# Patient Record
Sex: Female | Born: 1965 | Race: White | Hispanic: No | Smoking: Never smoker
Health system: Southern US, Community
[De-identification: ages and names within clinical notes are randomized; demographics above are authoritative.]

## PROBLEM LIST (undated history)

## (undated) DIAGNOSIS — I10 Essential (primary) hypertension: Secondary | ICD-10-CM

## (undated) DIAGNOSIS — M069 Rheumatoid arthritis, unspecified: Secondary | ICD-10-CM

## (undated) DIAGNOSIS — R519 Headache, unspecified: Secondary | ICD-10-CM

## (undated) DIAGNOSIS — M311 Thrombotic microangiopathy: Secondary | ICD-10-CM

## (undated) DIAGNOSIS — R51 Headache: Secondary | ICD-10-CM

## (undated) DIAGNOSIS — M3119 Other thrombotic microangiopathy: Secondary | ICD-10-CM

## (undated) DIAGNOSIS — I7300 Raynaud's syndrome without gangrene: Secondary | ICD-10-CM

## (undated) DIAGNOSIS — N189 Chronic kidney disease, unspecified: Secondary | ICD-10-CM

## (undated) DIAGNOSIS — K859 Acute pancreatitis without necrosis or infection, unspecified: Secondary | ICD-10-CM

## (undated) DIAGNOSIS — M360 Dermato(poly)myositis in neoplastic disease: Secondary | ICD-10-CM

## (undated) HISTORY — DX: Essential (primary) hypertension: I10

## (undated) HISTORY — DX: Chronic kidney disease, unspecified: N18.9

## (undated) HISTORY — DX: Other thrombotic microangiopathy: M31.19

## (undated) HISTORY — DX: Raynaud's syndrome without gangrene: I73.00

## (undated) HISTORY — DX: Thrombotic microangiopathy: M31.1

## (undated) HISTORY — DX: Headache: R51

## (undated) HISTORY — DX: Headache, unspecified: R51.9

---

## 2004-06-17 HISTORY — PX: SINUS SURGERY WITH INSTATRAK: SHX5215

## 2014-06-17 ENCOUNTER — Encounter (HOSPITAL_COMMUNITY): Payer: Self-pay | Admitting: Emergency Medicine

## 2014-06-17 ENCOUNTER — Emergency Department (INDEPENDENT_AMBULATORY_CARE_PROVIDER_SITE_OTHER)
Admission: EM | Admit: 2014-06-17 | Discharge: 2014-06-17 | Disposition: A | Discharge status: Home / Self Care | Payer: BC Managed Care – HMO | Source: Home / Self Care | Attending: Emergency Medicine | Admitting: Emergency Medicine

## 2014-06-17 DIAGNOSIS — J019 Acute sinusitis, unspecified: Secondary | ICD-10-CM

## 2014-06-17 DIAGNOSIS — J069 Acute upper respiratory infection, unspecified: Secondary | ICD-10-CM

## 2014-06-17 HISTORY — PX: ROTATOR CUFF REPAIR: SHX139

## 2014-06-17 MED ORDER — GUAIFENESIN-CODEINE 100-10 MG/5ML PO SYRP
5.0000 mL | ORAL_SOLUTION | Freq: Three times a day (TID) | ORAL | Status: DC | PRN
Start: 2014-06-17 — End: 2015-05-27

## 2014-06-17 MED ORDER — ALBUTEROL SULFATE HFA 108 (90 BASE) MCG/ACT IN AERS: 1.0000 | INHALATION_SPRAY | Freq: Four times a day (QID) | RESPIRATORY_TRACT | Status: DC | PRN

## 2014-06-17 MED ORDER — AMOXICILLIN 500 MG PO CAPS: 1000.0000 mg | ORAL_CAPSULE | Freq: Three times a day (TID) | ORAL | Status: DC

## 2014-06-17 MED ORDER — FLUTICASONE PROPIONATE 50 MCG/ACT NA SUSP: 2.0000 | Freq: Every day | NASAL | Status: AC

## 2014-06-17 NOTE — ED Notes (Signed)
C/o cold sx onset 3 days Began w/ST that now has subsided Sx today include: HA, congestion, runny nose Denies fevers, chills Taking OTC cold meds w/no relief Alert, no signs of acute distress.

## 2014-06-17 NOTE — Discharge Instructions (Signed)

## 2014-06-17 NOTE — ED Provider Notes (Signed)
Chief Complaint   URI   History of Present Illness   Shannon Woodard is a 49 year old female who recently moved here to [place]. She was exposed to a coworker with a similar illness over the past 5 days she's had severe sore throat, nasal congestion with yellow-green drainage, headache, sinus pressure, right ear pain, cough productive yellow-green sputum, chest tightness, subjective fever, chills, malaise and fatigue, and nausea.  Review of Systems   Other than as noted above, the patient denies any of the following symptoms: Systemic:  No fevers, chills, sweats, or myalgias. Eye:  No redness or discharge. ENT:  No ear pain, headache, nasal congestion, drainage, sinus pressure, or sore throat. Neck:  No neck pain, stiffness, or swollen glands. Lungs:  No cough, sputum production, hemoptysis, wheezing, chest tightness, shortness of breath or chest pain. GI:  No abdominal pain, nausea, vomiting or diarrhea.  [place]   Past medical history, family history, social history, meds, and allergies were reviewed. She is allergic to Dilantin. She has rheumatoid arthritis, hypertension, migraines, and dermatomyositis. She takes hydrocodone for pain, Norvasc for her blood pressure and Rituxan infusions for rheumatoid arthritis.  Physical exam   Vital signs:  BP 132/83 mmHg  Pulse 86  Temp(Src) 98.1 F (36.7 C) (Oral)  Resp 16  SpO2 100%  LMP 06/17/2014 General:  Alert and oriented.  In no distress.  Skin warm and dry. Eye:  No conjunctival injection or drainage. Lids were normal. ENT:  TMs and canals were normal, without erythema or inflammation.  Nasal mucosa was clear and uncongested, without drainage.  Mucous membranes were moist.  Pharynx was clear with no exudate or drainage.  There were no oral ulcerations or lesions. Neck:  Supple, no adenopathy, tenderness or mass. Lungs:  No respiratory distress.  Lungs were clear to auscultation, without wheezes, rales or rhonchi.  Breath  sounds were clear and equal bilaterally.  Heart:  Regular rhythm, without gallops, murmers or rubs. Skin:  Clear, warm, and dry, without rash or lesions.  Assessment     The primary encounter diagnosis was Viral URI. A diagnosis of Acute sinusitis, recurrence not specified, unspecified location was also pertinent to this visit.  He only been sick for 5 days, however I believe in view of her history of rheumatoid arthritis and use of Rituxan, she is somewhat immune suppressed, therefore I'm going to start antibiotics.  Plan    1.  Meds:  The following meds were prescribed:   Discharge Medication List as of 06/17/2014 11:23 AM    START taking these medications   Details  albuterol (PROVENTIL HFA;VENTOLIN HFA) 108 (90 BASE) MCG/ACT inhaler Inhale 1-2 puffs into the lungs every 6 (six) hours as needed for wheezing or shortness of breath., Starting 06/17/2014, Until Discontinued, Normal    amoxicillin (AMOXIL) 500 MG capsule Take 2 capsules (1,000 mg total) by mouth 3 (three) times daily., Starting 06/17/2014, Until Discontinued, Normal    fluticasone (FLONASE) 50 MCG/ACT nasal spray Place 2 sprays into both nostrils daily., Starting 06/17/2014, Until Discontinued, Normal    guaiFENesin-codeine (ROBITUSSIN AC) 100-10 MG/5ML syrup Take 5 mLs by mouth 3 (three) times daily as needed for cough., Starting 06/17/2014, Until Discontinued, Print        2.  Patient Education/Counseling:  The patient was given appropriate handouts, self care instructions, and instructed in symptomatic relief.  Instructed to get extra fluids and extra rest.    3.  Follow up:  The patient was told to follow up  here if no better in 3 to 4 days, or sooner if becoming worse in any way, and given some red flag symptoms such as increasing fever, difficulty breathing, chest pain, or persistent vomiting which would prompt immediate return.       Harden Mo, MD 06/17/14 [phone]

## 2015-05-27 ENCOUNTER — Emergency Department (HOSPITAL_COMMUNITY)
Admission: EM | Admit: 2015-05-27 | Discharge: 2015-05-28 | Disposition: A | Discharge status: Home / Self Care | Payer: BLUE CROSS/BLUE SHIELD | Attending: Emergency Medicine | Admitting: Emergency Medicine

## 2015-05-27 ENCOUNTER — Encounter (HOSPITAL_COMMUNITY): Payer: Self-pay | Admitting: *Deleted

## 2015-05-27 ENCOUNTER — Emergency Department (HOSPITAL_COMMUNITY): Payer: BLUE CROSS/BLUE SHIELD

## 2015-05-27 DIAGNOSIS — Z79899 Other long term (current) drug therapy: Secondary | ICD-10-CM | POA: Insufficient documentation

## 2015-05-27 DIAGNOSIS — Z9889 Other specified postprocedural states: Secondary | ICD-10-CM | POA: Diagnosis not present

## 2015-05-27 DIAGNOSIS — R1013 Epigastric pain: Secondary | ICD-10-CM

## 2015-05-27 DIAGNOSIS — Z8719 Personal history of other diseases of the digestive system: Secondary | ICD-10-CM | POA: Insufficient documentation

## 2015-05-27 DIAGNOSIS — R1012 Left upper quadrant pain: Secondary | ICD-10-CM | POA: Diagnosis present

## 2015-05-27 DIAGNOSIS — J984 Other disorders of lung: Secondary | ICD-10-CM

## 2015-05-27 DIAGNOSIS — K2970 Gastritis, unspecified, without bleeding: Secondary | ICD-10-CM | POA: Insufficient documentation

## 2015-05-27 DIAGNOSIS — N83202 Unspecified ovarian cyst, left side: Secondary | ICD-10-CM | POA: Diagnosis not present

## 2015-05-27 DIAGNOSIS — N200 Calculus of kidney: Secondary | ICD-10-CM | POA: Diagnosis not present

## 2015-05-27 DIAGNOSIS — Z7951 Long term (current) use of inhaled steroids: Secondary | ICD-10-CM | POA: Insufficient documentation

## 2015-05-27 DIAGNOSIS — Z8739 Personal history of other diseases of the musculoskeletal system and connective tissue: Secondary | ICD-10-CM | POA: Diagnosis not present

## 2015-05-27 HISTORY — DX: Dermato(poly)myositis in neoplastic disease: M36.0

## 2015-05-27 HISTORY — DX: Rheumatoid arthritis, unspecified: M06.9

## 2015-05-27 HISTORY — DX: Acute pancreatitis without necrosis or infection, unspecified: K85.90

## 2015-05-27 LAB — COMPREHENSIVE METABOLIC PANEL
ALT: 18 U/L (ref 14–54)
AST: 21 U/L (ref 15–41)
Albumin: 4.5 g/dL (ref 3.5–5.0)
Alkaline Phosphatase: 62 U/L (ref 38–126)
Anion gap: 5 (ref 5–15)
BUN: 15 mg/dL (ref 6–20)
CO2: 23 mmol/L (ref 22–32)
Calcium: 8.8 mg/dL — ABNORMAL LOW (ref 8.9–10.3)
Chloride: 111 mmol/L (ref 101–111)
Creatinine, Ser: 0.99 mg/dL (ref 0.44–1.00)
GFR calc Af Amer: >60 mL/min (ref >60)
GFR calc non Af Amer: >60 mL/min (ref >60)
Glucose, Bld: 83 mg/dL (ref 65–99)
Potassium: 3.4 mmol/L — ABNORMAL LOW (ref 3.5–5.1)
Sodium: 139 mmol/L (ref 135–145)
Total Bilirubin: 0.5 mg/dL (ref 0.3–1.2)
Total Protein: 6.6 g/dL (ref 6.5–8.1)

## 2015-05-27 LAB — URINALYSIS, ROUTINE W REFLEX MICROSCOPIC
Bilirubin Urine: NEGATIVE
Glucose, UA: NEGATIVE mg/dL
Hgb urine dipstick: NEGATIVE
Ketones, ur: NEGATIVE mg/dL
Leukocytes, UA: NEGATIVE
Nitrite: NEGATIVE
Protein, ur: NEGATIVE mg/dL
Specific Gravity, Urine: 1.012 (ref 1.005–1.030)
pH: 6.5 (ref 5.0–8.0)

## 2015-05-27 LAB — CBC WITH DIFFERENTIAL/PLATELET
Basophils Absolute: 0 10*3/uL (ref 0.0–0.1)
Basophils Relative: 1 %
Eosinophils Absolute: 0.2 10*3/uL (ref 0.0–0.7)
Eosinophils Relative: 4 %
HCT: 39.8 % (ref 36.0–46.0)
Hemoglobin: 13.8 g/dL (ref 12.0–15.0)
Lymphocytes Relative: 22 %
Lymphs Abs: 1.2 10*3/uL (ref 0.7–4.0)
MCH: 33.2 pg (ref 26.0–34.0)
MCHC: 34.7 g/dL (ref 30.0–36.0)
MCV: 95.7 fL (ref 78.0–100.0)
Monocytes Absolute: 0.6 10*3/uL (ref 0.1–1.0)
Monocytes Relative: 12 %
Neutro Abs: 3.1 10*3/uL (ref 1.7–7.7)
Neutrophils Relative %: 61 %
Platelets: 164 10*3/uL (ref 150–400)
RBC: 4.16 MIL/uL (ref 3.87–5.11)
RDW: 12.4 % (ref 11.5–15.5)
WBC: 5.1 10*3/uL (ref 4.0–10.5)

## 2015-05-27 LAB — I-STAT TROPONIN, ED: Troponin i, poc: 0 ng/mL (ref 0.00–0.08)

## 2015-05-27 LAB — LIPASE, BLOOD: Lipase: 61 U/L — ABNORMAL HIGH (ref 11–51)

## 2015-05-27 LAB — I-STAT BETA HCG BLOOD, ED (MC, WL, AP ONLY): I-stat hCG, quantitative: <5 m[IU]/mL (ref <5)

## 2015-05-27 MED ORDER — IOHEXOL 300 MG/ML  SOLN
100.0000 mL | Freq: Once | INTRAMUSCULAR | Status: AC | PRN
  Administered 2015-05-27: 100 mL via INTRAVENOUS

## 2015-05-27 MED ORDER — MORPHINE SULFATE (PF) 4 MG/ML IV SOLN
4.0000 mg | Freq: Once | INTRAVENOUS | Status: AC
  Administered 2015-05-27: 4 mg via INTRAVENOUS
  Filled 2015-05-27: qty 1

## 2015-05-27 MED ORDER — ONDANSETRON HCL 4 MG/2ML IJ SOLN
4.0000 mg | Freq: Once | INTRAMUSCULAR | Status: AC
  Administered 2015-05-27: 4 mg via INTRAVENOUS
  Filled 2015-05-27: qty 2

## 2015-05-27 MED ORDER — SODIUM CHLORIDE 0.9 % IV BOLUS (SEPSIS)
1000.0000 mL | Freq: Once | INTRAVENOUS | Status: AC
  Administered 2015-05-27: 1000 mL via INTRAVENOUS

## 2015-05-27 NOTE — ED Notes (Signed)
Nurse starting ultrasound IV and getting labs

## 2015-05-27 NOTE — ED Provider Notes (Signed)
CSN: [id number]     Arrival date & time 05/27/15  2106 History   First MD Initiated Contact with Patient 05/27/15 2130     Chief Complaint  Patient presents with  . Abdominal Pain     (Consider location/radiation/quality/duration/timing/severity/associated sxs/prior Treatment) HPI Comments: Thurs evening cat jumped from head board to stomach, had pain then improved then pain started again   Patient is a 49 y.o. female presenting with abdominal pain.  Abdominal Pain Pain location:  LUQ Pain quality comment:  Like something is going to pop, constant but waxes and wanes, feels shooting when it comes on Pain radiates to:  Does not radiate Pain severity:  Moderate Onset quality:  Gradual Duration:  1 day Timing:  Constant Progression:  Worsening Chronicity:  New Context: not alcohol use   Relieved by:  None tried (sitting up helps) Exacerbated by: laying down flat, dont think food changes it. Ineffective treatments:  None tried Associated symptoms: no chest pain, no constipation, no cough (improved now), no diarrhea, no dysuria, no fatigue, no fever, no nausea, no shortness of breath, no sore throat, no vaginal bleeding, no vaginal discharge and no vomiting   Associated symptoms comment:  Just had URI and UTI was treated with cefdinir   Risk factors: has not had multiple surgeries, no NSAID use and not pregnant     Past Medical History  Diagnosis Date  . Rheumatoid arthritis ([place])   . 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 Pancreatitis    Past Surgical History  Procedure Laterality Date  . Cesarean section    . Sinus surgery with instatrak    . Rotator cuff repair     No family history on file. Social History  Substance Use Topics  . Smoking status: Never Smoker   . Smokeless tobacco: None  . Alcohol Use: Yes     Comment: rarely   OB History    No data available     Review of Systems  Constitutional: Negative for fever and fatigue.  HENT:  Negative for sore throat.   Eyes: Negative for visual disturbance.  Respiratory: Negative for cough (improved now) and shortness of breath.   Cardiovascular: Negative for chest pain.  Gastrointestinal: Positive for abdominal pain. Negative for nausea, vomiting, diarrhea and constipation.  Genitourinary: Negative for dysuria, vaginal bleeding, vaginal discharge and difficulty urinating.  Musculoskeletal: Negative for back pain and neck pain.  Skin: Negative for rash.  Neurological: Negative for syncope and headaches.      Allergies  Dilantin  Home Medications   Prior to Admission medications   Medication Sig Start Date End Date Taking? Authorizing Provider  albuterol (PROVENTIL HFA;VENTOLIN HFA) 108 (90 BASE) MCG/ACT inhaler Inhale 1-2 puffs into the lungs every 6 (six) hours as needed for wheezing or shortness of breath. 06/17/14  Yes Harden Mo, MD  amLODipine (NORVASC) 10 MG tablet Take 10 mg by mouth daily.   Yes Historical Provider, MD  fluticasone (FLONASE) 50 MCG/ACT nasal spray Place 2 sprays into both nostrils daily. 06/17/14  Yes Harden Mo, MD  omeprazole (PRILOSEC) 40 MG capsule Take 1 capsule by mouth daily. 03/24/15  Yes Historical Provider, MD  topiramate (TOPAMAX) 100 MG tablet Take 100 mg by mouth 2 (two) times daily.   Yes Historical Provider, MD  cefdinir (OMNICEF) 300 MG capsule Take 1 capsule by mouth 2 (two) times daily. 10 day course starting 11/28 05/15/15   Historical Provider, MD  pantoprazole (PROTONIX) 20 MG tablet Take 2  tablets (40 mg total) by mouth daily. 05/28/15   Gareth Morgan, MD   BP 119/79 mmHg  Pulse 79  Temp(Src) 98.4 F (36.9 C) (Oral)  Resp 20  SpO2 98%  LMP 04/28/2015 Physical Exam  Constitutional: She is oriented to person, place, and time. She appears well-developed and well-nourished. No distress.  HENT:  Head: Normocephalic and atraumatic.  Eyes: Conjunctivae and EOM are normal.  Neck: Normal range of motion.  Cardiovascular:  Normal rate, regular rhythm, normal heart sounds and intact distal pulses.  Exam reveals no gallop and no friction rub.   No murmur heard. Pulmonary/Chest: Effort normal and breath sounds normal. No respiratory distress. She has no wheezes. She has no rales.  Abdominal: Soft. She exhibits no distension. There is tenderness (worse in epigastrum) in the right upper quadrant, epigastric area and left upper quadrant. There is no guarding and negative Murphy's sign.  Musculoskeletal: She exhibits no edema or tenderness.  Neurological: She is alert and oriented to person, place, and time.  Skin: Skin is warm and dry. No rash noted. She is not diaphoretic. No erythema.  Nursing note and vitals reviewed.   ED Course  Procedures (including critical care time) Labs Review Labs Reviewed  COMPREHENSIVE METABOLIC PANEL - Abnormal; Notable for the following:    Potassium 3.4 (*)    Calcium 8.8 (*)    All other components within normal limits  LIPASE, BLOOD - Abnormal; Notable for the following:    Lipase 61 (*)    All other components within normal limits  CBC WITH DIFFERENTIAL/PLATELET  URINALYSIS, ROUTINE W REFLEX MICROSCOPIC (NOT AT [facility])  I-STAT TROPOININ, ED  I-STAT BETA HCG BLOOD, ED ([facility], [facility], [facility] ONLY)    Imaging Review Ct Abdomen Pelvis W Contrast  05/28/2015  CLINICAL DATA:  LEFT-sided abdominal pain beginning yesterday, worse when laying down. Mild nausea. History of pancreatitis. Recent treatment for urinary tract infection. History of polymyositis, pancreatitis, rheumatoid arthritis. EXAM: CT ABDOMEN AND PELVIS WITH CONTRAST TECHNIQUE: Multidetector CT imaging of the abdomen and pelvis was performed using the standard protocol following bolus administration of intravenous contrast. CONTRAST:  187mL OMNIPAQUE IOHEXOL 300 MG/ML  SOLN COMPARISON:  None. FINDINGS: LUNG BASES: Mild suspected honeycombing in the lung bases. Lingular atelectasis/ scarring. Heart size is normal, no pericardial  effusions. SOLID ORGANS: The liver demonstrates subcentimeter probable cyst segment 5, mild focal fatty infiltration about the falciform ligament. Spleen, gallbladder, pancreas and adrenal glands are unremarkable. GASTROINTESTINAL TRACT: The stomach, small bowel are normal in course and caliber without inflammatory changes. Moderate amount of retained large bowel stool. Redundant cecum and LEFT lower quadrant. KIDNEYS/ URINARY TRACT: Kidneys are orthotopic, demonstrating symmetric enhancement. 3 mm LEFT lower pole nephrolithiasis. No hydronephrosis or solid renal masses. The unopacified ureters are normal in course and caliber. Delayed imaging through the kidneys demonstrates symmetric prompt contrast excretion within the proximal urinary collecting system. Urinary bladder is well distended and unremarkable. PERITONEUM/RETROPERITONEUM: Aortoiliac vessels are normal in course and caliber, trace calcific atherosclerosis. No lymphadenopathy by CT size criteria. Multiple subcentimeter enhancing probable leiomyomas ; deformity of the endometrium conferring intramural leiomyoma. Multiple involuting follicles measuring up to 1.7 cm LEFT ovary. Small amount of free fluid in the pelvis is likely physiologic. Small amount of free fluid about the LEFT adnexae. SOFT TISSUE/OSSEOUS STRUCTURES: Non-suspicious. Probable suture anchor RIGHT pubic symphysis. Grade 1 L5-S1 anterolisthesis without spondylolysis. IMPRESSION: Nonobstructing LEFT 3 mm nephrolithiasis. Involuting, possibly ruptured LEFT ovarian follicles/cysts measuring up to 1.7 cm with small amount of  free fluid about LEFT adnexa. Multiple apparent uterine leiomyomas. Moderate amount of retained large bowel stool without bowel obstruction. Suspected honeycombing in the lung bases which can be seen with interstitial lung disease and, would be better characterized on high-resolution CT chest on a nonemergent basis. Electronically Signed   By: Elon Alas M.D.   On:  05/28/2015 00:23   I have personally reviewed and evaluated these images and lab results as part of my medical decision-making.   EKG Interpretation   Date/Time:  Saturday May 27 2015 21:57:50 EST Ventricular Rate:  71 PR Interval:  174 QRS Duration: 98 QT Interval:  422 QTC Calculation: 459 R Axis:   72 Text Interpretation:  Sinus rhythm Artifact No prior ECG to compare  Confirmed by [facility] MD, Rebeckah Masih ([id number]) on 05/27/2015 11:53:25 PM      MDM   Final diagnoses:  Epigastric pain  Cyst of left ovary, possibly ruptured  Nephrolithiasis, 61mm left sided, nonobstructing  Gastritis  Honeycomb lung, possible signs of interstitial lung disease on CT abdomen, recommend outpt CT Chest for further evaluation   49 year old female with a history of rheumatoid arthritis, traumatic polymyositis, pancreatitis presents with concern of left upper quadrant pain beginning yesterday. Patient states that her cat jumped from a height onto her abdomen the night before the pain began. Location of pain and question of trauma concerning for possible splenic injury, however pt with tenderness also in epigastrum and RUQ.  Given tenderness in LUQ not significant and positional nature of pain in pt with inflammatory disease hx, ECG was ordered to evaluate for pericarditis. Istat trop negative.  No CP and doubt ACS/PE. Lipase mildly elevated, however not 3 times the upper limit of normal.  CT abdomen ordered showing left ovarian cyst, possible rupture with free fluid, nonobstructing stone, signs of interstitial lung disease.  Pt without lower pelvic pain and doubt torsion/PID/abscess.  Recommend OBGYN follow up for cyst and PCP follow up for other abdominal pain. Possible gastritis, wrote rx for protonix and discussed may take this or omeprazole.  Patient discharged in stable condition with understanding of reasons to return.     Gareth Morgan, MD 05/28/15 0140

## 2015-05-27 NOTE — ED Notes (Signed)
Pt states that she began to have left sided abd pain yesterday; pt states that the pain is worse when she lays on her back; pt states that she feel like she needs to stretch her chest up to allow for more room in her abdomen; pt c/o mild nausea but no vomiting or diarrhea; pt states that she has a hx of pancreatitis and was recently treated for a UTI

## 2015-05-28 MED ORDER — PANTOPRAZOLE SODIUM 20 MG PO TBEC: 40.0000 mg | DELAYED_RELEASE_TABLET | Freq: Every day | ORAL | Status: DC

## 2015-05-28 MED ORDER — MORPHINE SULFATE (PF) 4 MG/ML IV SOLN
4.0000 mg | Freq: Once | INTRAVENOUS | Status: AC
  Administered 2015-05-28: 4 mg via INTRAVENOUS
  Filled 2015-05-28: qty 1

## 2015-05-28 NOTE — Discharge Instructions (Signed)

## 2015-07-03 ENCOUNTER — Encounter: Payer: Self-pay | Admitting: Diagnostic Neuroimaging

## 2015-07-03 ENCOUNTER — Ambulatory Visit (INDEPENDENT_AMBULATORY_CARE_PROVIDER_SITE_OTHER): Payer: 59 | Admitting: Diagnostic Neuroimaging

## 2015-07-03 VITALS — BP 125/78 | HR 65 | Ht 63.0 in | Wt 121.4 lb

## 2015-07-03 DIAGNOSIS — G43109 Migraine with aura, not intractable, without status migrainosus: Secondary | ICD-10-CM | POA: Diagnosis not present

## 2015-07-03 MED ORDER — SUMATRIPTAN 20 MG/ACT NA SOLN: 20.0000 mg | NASAL | Status: DC | PRN

## 2015-07-03 MED ORDER — GABAPENTIN 100 MG PO CAPS: 100.0000 mg | ORAL_CAPSULE | Freq: Every day | ORAL | Status: DC

## 2015-07-03 MED ORDER — TOPIRAMATE 100 MG PO TABS: 100.0000 mg | ORAL_TABLET | Freq: Two times a day (BID) | ORAL | Status: AC

## 2015-07-03 NOTE — Progress Notes (Signed)
[facility]  PATIENT: Shannon Woodard [facility] DOB: [birth year]  REFERRING CLINICIAN: Julien Girt HISTORY FROM: patient  REASON FOR VISIT: new consult    HISTORICAL  CHIEF COMPLAINT:  Chief Complaint  Patient presents with  . Migraine    rm 7 , New Pt, "new to area, recently dx w/RA; migraines w/menses and midmonth"    HISTORY OF PRESENT ILLNESS:   50 year old female here for evaluation of headaches/migraines.  Since eighth grade patient has had intermittent headaches, unilateral, ice pick sensation, eye watering, photophobia, phonophobia. She would've nausea and vomiting. Sometimes she would miss school. Patient was diagnosed with migraine headaches. Sometimes she sees spots and sparkles with these.  Patient had these headaches intermittently throughout her life. At some point she was started on topiramate and did well. She also tried Imitrex nasal spray and injection with good results. At one point she tried Botox, but this caused neck weakness and worsening headaches.  One year ago patient moved to [place] and is now trying to establish care. Patient having 1-10 days of migraine headache per month. She averages 4 headaches per month. Headaches last up to 12 hours. Headaches tend to be associated with her menstrual cycle.   REVIEW OF SYSTEMS: Full 14 system review of systems performed and notable only for memory loss confusion headache restless legs joint pain cramps allergies palpitations murmur itching.  ALLERGIES: Allergies  Allergen Reactions  . Dilantin [Phenytoin Sodium Extended] Rash and Other (See Comments)    Extreme sedation    HOME MEDICATIONS: Outpatient Prescriptions Prior to Visit  Medication Sig Dispense Refill  . albuterol (PROVENTIL HFA;VENTOLIN HFA) 108 (90 BASE) MCG/ACT inhaler Inhale 1-2 puffs into the lungs every 6 (six) hours as needed for wheezing or shortness of breath. 1 Inhaler 0  . amLODipine (NORVASC) 10 MG tablet Take 10 mg  by mouth daily.    . fluticasone (FLONASE) 50 MCG/ACT nasal spray Place 2 sprays into both nostrils daily. 16 g 0  . omeprazole (PRILOSEC) 40 MG capsule Take 1 capsule by mouth daily.  2  . topiramate (TOPAMAX) 100 MG tablet Take 100 mg by mouth 2 (two) times daily.    . cefdinir (OMNICEF) 300 MG capsule Take 1 capsule by mouth 2 (two) times daily. 10 day course starting 11/28  0  . pantoprazole (PROTONIX) 20 MG tablet Take 2 tablets (40 mg total) by mouth daily. 30 tablet 0   No facility-administered medications prior to visit.    PAST MEDICAL HISTORY: Past Medical History  Diagnosis Date  . Rheumatoid arthritis ([place])   . 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 Pancreatitis   . Hypertension   . Chronic kidney disease     hx kidney failure- resolved  . 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 Raynauds phenomenon   . Headache     migraines    PAST SURGICAL HISTORY: Past Surgical History  Procedure Laterality Date  . Cesarean section  1990, 1994  . Sinus surgery with instatrak  2006  . Rotator cuff repair Left 2016    FAMILY HISTORY: Family History  Problem Relation Age of Onset  . Cancer Mother     brain  . Thyroid disease Sister   . Diabetes Brother   . Alzheimer's disease Paternal Grandmother   . Cancer Paternal Grandfather     bone cancer    SOCIAL HISTORY:  Social History   Social History  . Marital Status: Divorced    Spouse Name:  N/A  . Number of Children: 2  . Years of Education: 16   Occupational History  .      [employer]   Social History Main Topics  . Smoking status: Never Smoker   . Smokeless tobacco: Not on file  . Alcohol Use: Yes     Comment: rarely  . Drug Use: No  . Sexual Activity: Not on file   Other Topics Concern  . Not on file   Social History Narrative   Lives at home with sister, brother-in-law; has significant other   caffeine use- coffee, 1-2 cups daily      PHYSICAL EXAM  GENERAL  EXAM/CONSTITUTIONAL: Vitals:  Filed Vitals:   07/03/15 0911  BP: 125/78  Pulse: 65  Height: 5\' 3"  (1.6 m)  Weight: 121 lb 6.4 oz (55.067 kg)     Body mass index is 21.51 kg/(m^2).  Visual Acuity Screening   Right eye Left eye Both eyes  Without correction: 20/30 20/30   With correction:        Patient is in no distress; well developed, nourished and groomed; neck is supple  CARDIOVASCULAR:  Examination of carotid arteries is normal; no carotid bruits  Regular rate and rhythm, no murmurs  Examination of peripheral vascular system by observation and palpation is normal  EYES:  Ophthalmoscopic exam of optic discs and posterior segments is normal; no papilledema or hemorrhages  MUSCULOSKELETAL:  Gait, strength, tone, movements noted in Neurologic exam below  NEUROLOGIC: MENTAL STATUS:  No flowsheet data found.  awake, alert, oriented to person, place and time  recent and remote memory intact  normal attention and concentration  language fluent, comprehension intact, naming intact,   fund of knowledge appropriate  CRANIAL NERVE:   2nd - no papilledema on fundoscopic exam  2nd, 3rd, 4th, 6th - pupils equal and reactive to light, visual fields full to confrontation, extraocular muscles intact, no nystagmus  5th - facial sensation symmetric  7th - facial strength symmetric  8th - hearing intact  9th - palate elevates symmetrically, uvula midline  11th - shoulder shrug symmetric  12th - tongue protrusion midline  MOTOR:   normal bulk and tone, full strength in the BUE, BLE  SENSORY:   normal and symmetric to light touch, pinprick, temperature, vibration    COORDINATION:   finger-nose-finger, fine finger movements normal  REFLEXES:   deep tendon reflexes present and symmetric  GAIT/STATION:   narrow based gait; able to walk on tandem; romberg is negative    DIAGNOSTIC DATA (LABS, IMAGING, TESTING) - I reviewed patient records, labs,  notes, testing and imaging myself where available.  Lab Results  Component Value Date   WBC 5.1 05/27/2015   HGB 13.8 05/27/2015   HCT 39.8 05/27/2015   MCV 95.7 05/27/2015   PLT 164 05/27/2015      Component Value Date/Time   NA 139 05/27/2015 2307   K 3.4* 05/27/2015 2307   CL 111 05/27/2015 2307   CO2 23 05/27/2015 2307   GLUCOSE 83 05/27/2015 2307   BUN 15 05/27/2015 2307   CREATININE 0.99 05/27/2015 2307   CALCIUM 8.8* 05/27/2015 2307   PROT 6.6 05/27/2015 2307   ALBUMIN 4.5 05/27/2015 2307   AST 21 05/27/2015 2307   ALT 18 05/27/2015 2307   ALKPHOS 62 05/27/2015 2307   BILITOT 0.5 05/27/2015 2307   GFRNONAA >60 05/27/2015 2307   GFRAA >60 05/27/2015 2307   No results found for: CHOL, HDL, LDLCALC, LDLDIRECT, TRIG, CHOLHDL  No results found for: HGBA1C No results found for: VITAMINB12 No results found for: TSH     ASSESSMENT AND PLAN  50 y.o. year old female here with history of migraine headaches with aura since eighth grade. Left eye slightly increasing topiramate although advise caution as patient has history of asymptomatic kidney stones. Also we'll try gabapentin and sumatriptan nasal spray.   Dx:   Migraine with aura and without status migrainosus, not intractable     PLAN: - increase topiramate to 100mg  BID; caution with kidney stones; drink plenty of fluids - trial of gabapentin 100mg  qhs; increase to 300mg  as tolerated - trial of sumatriptan nasal spray   Meds ordered this encounter  Medications  . topiramate (TOPAMAX) 100 MG tablet    Sig: Take 1 tablet (100 mg total) by mouth 2 (two) times daily.    Dispense:  180 tablet    Refill:  4  . SUMAtriptan (IMITREX) 20 MG/ACT nasal spray    Sig: Place 1 spray (20 mg total) into the nose every 2 (two) hours as needed for migraine or headache. May repeat in 2 hours if headache persists or recurs.    Dispense:  6 Inhaler    Refill:  6  . gabapentin (NEURONTIN) 100 MG capsule    Sig: Take 1-3  capsules (100-300 mg total) by mouth at bedtime.    Dispense:  90 capsule    Refill:  6   Return in about 2 months (around 08/31/2015).    Penni Bombard, MD [id number], [id number] AM Certified in Neurology, Neurophysiology and Neuroimaging  [facility] [address] [place], [place] [address] [phone]

## 2015-07-03 NOTE — Patient Instructions (Addendum)
Thank you for coming to see [place] at [facility]. I hope we have been able to provide you high quality care today.  You may receive a patient satisfaction survey over the next few weeks. We would appreciate your feedback and comments so that we may continue to improve ourselves and the health of our patients.  - increase topiramate to 176m twice a day; drink plenty of fluids; caution with kidney stone development - use sumatriptan nasal spray as needed for migraine - try gabapentin 1021mat bedtime; gradually increase to 30074mt bedtime - may use over the counter tylenol, excedrin migraine or ibuprofen as needed with above medications; caution with stomach pain or suspected gastritis   ~~~~~~~~~~~~~~~~~~~~~~~~~~~~~~~~~~~~~~~~~~~~~~~~~~~~~~~~~~~~~~~~~  DR. Townes Fuhs'S GUIDE TO HAPPY AND HEALTHY LIVING These are some of my general health and wellness recommendations. Some of them may apply to you better than others. Please use common sense as you try these suggestions and feel free to ask me any questions.   ACTIVITY/FITNESS Mental, social, emotional and physical stimulation are very important for brain and body health. Try learning a new activity (arts, music, language, sports, games).  Keep moving your body to the best of your abilities. You can do this at home, inside or outside, the park, community center, gym or anywhere you like. Consider a physical therapist or personal trainer to get started. Consider the app Sworkit. Fitness trackers such as smart-watches, smart-phones or Fitbits can help as well.   NUTRITION Eat more plants: colorful vegetables, nuts, seeds and berries.  Eat less sugar, salt, preservatives and processed foods.  Avoid toxins such as cigarettes and alcohol.  Drink water when you are thirsty. Warm water with a slice of lemon is an excellent morning drink to start the day.  Consider these websites for more information The Nutrition Source  (htthttps://www.henry-hernandez.biz/recision Nutrition (wwwWindowBlog.ch RELAXATION Consider practicing mindfulness meditation or other relaxation techniques such as deep breathing, prayer, yoga, tai chi, massage. See website mindful.org or the apps Headspace or Calm to help get started.   SLEEP Try to get at least 7-8+ hours sleep per day. Regular exercise and reduced caffeine will help you sleep better. Practice good sleep hygeine techniques. See website sleep.org for more information.   PLANNING Prepare estate planning, living will, healthcare POA documents. Sometimes this is best planned with the help of an attorney. Theconversationproject.org and agingwithdignity.org are excellent resources.

## 2015-08-21 ENCOUNTER — Telehealth: Payer: Self-pay | Admitting: Diagnostic Neuroimaging

## 2015-08-21 NOTE — Telephone Encounter (Signed)
Pt called to c/a appt with Dr Leta Baptist. She said at the [place] she feels he did not listen to how often she has migraines. Sts he told her to continue taking the Goody powders and Excedrin Migraine along with Imitrex prn. Sts she had HA for 15 days after OV and feels she took an excessive amount of Goody Powders. The preventative with the past neurologist was topamax, fioricet and imitrex. She said it worked really well, she used fioricet and imitrex for rescue medications. She said Dr Aundria Rud kept her on  topamax which he increased to 200mg , the fioricet which he was against and did not refill, and added gabapentin. Sts she could not tolerate gabapentin as it gave her H   She is requesting to see Dr Jaynee Eagles.  Pt is aware she will receive a phone with the providers decision.

## 2015-08-22 NOTE — Telephone Encounter (Signed)
I will discuss with Dr. Leta Baptist thanks

## 2015-08-24 NOTE — Telephone Encounter (Signed)
Hi Shannon Woodard, would you let patient know that I am happy to see her but just to let her know that I don't usually prescribe Fioricet either. But happy to see her thanks.

## 2015-08-24 NOTE — Telephone Encounter (Signed)
Ok to see Dr. Jaynee Eagles if she agrees.

## 2015-08-25 NOTE — Telephone Encounter (Signed)
Called and spoke to pt. Advised ok per Dr Jaynee Eagles to see her. She spoke to Dr Leta Baptist. Dr Jaynee Eagles does not usually prescribe Fioricet either. She was driving and is going to call office back to schedule appt w/ Dr Jaynee Eagles. Told her phone staff can also schedule f/u. She verbalized understanding.   Please schedule f/u w/ Dr Jaynee Eagles when she calls back, thank you.

## 2015-10-05 ENCOUNTER — Ambulatory Visit: Payer: 59 | Admitting: Diagnostic Neuroimaging

## 2015-12-04 ENCOUNTER — Other Ambulatory Visit: Payer: Self-pay | Admitting: Family Medicine

## 2015-12-04 DIAGNOSIS — R1084 Generalized abdominal pain: Secondary | ICD-10-CM

## 2015-12-06 ENCOUNTER — Ambulatory Visit
Admission: RE | Admit: 2015-12-06 | Discharge: 2015-12-06 | Disposition: A | Discharge status: Home / Self Care | Payer: 59 | Source: Ambulatory Visit | Attending: Family Medicine | Admitting: Family Medicine

## 2015-12-06 DIAGNOSIS — R1084 Generalized abdominal pain: Secondary | ICD-10-CM

## 2015-12-12 ENCOUNTER — Other Ambulatory Visit: Payer: BC Managed Care – HMO

## 2016-07-07 ENCOUNTER — Other Ambulatory Visit: Payer: Self-pay | Admitting: Diagnostic Neuroimaging

## 2016-08-19 ENCOUNTER — Other Ambulatory Visit: Payer: Self-pay | Admitting: Diagnostic Neuroimaging

## 2017-09-02 ENCOUNTER — Encounter: Payer: Self-pay | Admitting: Family Medicine

## 2018-04-03 ENCOUNTER — Encounter: Payer: Self-pay | Admitting: Family Medicine

## 2019-03-16 ENCOUNTER — Other Ambulatory Visit: Payer: Self-pay | Admitting: Family Medicine

## 2019-03-16 ENCOUNTER — Other Ambulatory Visit: Payer: Self-pay

## 2019-03-16 ENCOUNTER — Ambulatory Visit
Admission: RE | Admit: 2019-03-16 | Discharge: 2019-03-16 | Disposition: A | Discharge status: Home / Self Care | Payer: 59 | Source: Ambulatory Visit | Attending: Family Medicine | Admitting: Family Medicine

## 2019-03-16 DIAGNOSIS — R519 Headache, unspecified: Secondary | ICD-10-CM

## 2019-03-16 MED ORDER — GADOBENATE DIMEGLUMINE 529 MG/ML IV SOLN
10.0000 mL | Freq: Once | INTRAVENOUS | Status: AC | PRN
  Administered 2019-03-16: 10 mL via INTRAVENOUS

## 2019-04-06 ENCOUNTER — Ambulatory Visit: Payer: 59 | Admitting: Cardiology

## 2019-07-26 ENCOUNTER — Other Ambulatory Visit: Payer: Self-pay | Admitting: Family Medicine

## 2019-07-26 DIAGNOSIS — R1030 Lower abdominal pain, unspecified: Secondary | ICD-10-CM

## 2019-07-27 ENCOUNTER — Ambulatory Visit: Payer: 59

## 2019-07-27 ENCOUNTER — Other Ambulatory Visit: Payer: Self-pay | Admitting: Family Medicine

## 2019-07-27 ENCOUNTER — Other Ambulatory Visit: Payer: Self-pay

## 2019-07-27 DIAGNOSIS — R1030 Lower abdominal pain, unspecified: Secondary | ICD-10-CM

## 2019-08-13 ENCOUNTER — Telehealth: Payer: Self-pay | Admitting: Hematology

## 2019-08-13 NOTE — Telephone Encounter (Signed)
Scheduled per referral. Called and spoke with pt, confirmed 3/10 appt. Pt aware to arrive 71mins early and to also bring insurance card and photo ID.

## 2019-08-24 ENCOUNTER — Other Ambulatory Visit: Payer: Self-pay

## 2019-08-24 DIAGNOSIS — C9000 Multiple myeloma not having achieved remission: Secondary | ICD-10-CM

## 2019-08-25 ENCOUNTER — Inpatient Hospital Stay: Payer: 59 | Attending: Hematology | Admitting: Hematology

## 2019-08-25 ENCOUNTER — Other Ambulatory Visit: Payer: Self-pay

## 2019-08-25 ENCOUNTER — Inpatient Hospital Stay: Payer: 59

## 2019-08-25 VITALS — BP 121/81 | HR 87 | Temp 98.0°F | Resp 16 | Ht 62.0 in | Wt 112.7 lb

## 2019-08-25 DIAGNOSIS — R079 Chest pain, unspecified: Secondary | ICD-10-CM | POA: Diagnosis not present

## 2019-08-25 DIAGNOSIS — C9000 Multiple myeloma not having achieved remission: Secondary | ICD-10-CM

## 2019-08-25 DIAGNOSIS — D472 Monoclonal gammopathy: Secondary | ICD-10-CM | POA: Diagnosis not present

## 2019-08-25 DIAGNOSIS — R509 Fever, unspecified: Secondary | ICD-10-CM | POA: Diagnosis not present

## 2019-08-25 DIAGNOSIS — Z808 Family history of malignant neoplasm of other organs or systems: Secondary | ICD-10-CM | POA: Insufficient documentation

## 2019-08-25 DIAGNOSIS — R1030 Lower abdominal pain, unspecified: Secondary | ICD-10-CM | POA: Insufficient documentation

## 2019-08-25 DIAGNOSIS — G43909 Migraine, unspecified, not intractable, without status migrainosus: Secondary | ICD-10-CM | POA: Insufficient documentation

## 2019-08-25 DIAGNOSIS — M069 Rheumatoid arthritis, unspecified: Secondary | ICD-10-CM | POA: Diagnosis not present

## 2019-08-25 LAB — HEPATITIS C ANTIBODY: HCV Ab: NONREACTIVE

## 2019-08-25 LAB — CMP (CANCER CENTER ONLY)
ALT: 18 U/L (ref 0–44)
AST: 21 U/L (ref 15–41)
Albumin: 4.6 g/dL (ref 3.5–5.0)
Alkaline Phosphatase: 74 U/L (ref 38–126)
Anion gap: 9 (ref 5–15)
BUN: 15 mg/dL (ref 6–20)
CO2: 25 mmol/L (ref 22–32)
Calcium: 9.1 mg/dL (ref 8.9–10.3)
Chloride: 109 mmol/L (ref 98–111)
Creatinine: 0.87 mg/dL (ref 0.44–1.00)
GFR, Est AFR Am: >60 mL/min (ref >60)
GFR, Estimated: >60 mL/min (ref >60)
Glucose, Bld: 85 mg/dL (ref 70–99)
Potassium: 3.7 mmol/L (ref 3.5–5.1)
Sodium: 143 mmol/L (ref 135–145)
Total Bilirubin: 0.3 mg/dL (ref 0.3–1.2)
Total Protein: 6.7 g/dL (ref 6.5–8.1)

## 2019-08-25 LAB — CBC WITH DIFFERENTIAL/PLATELET
Abs Immature Granulocytes: 0.01 10*3/uL (ref 0.00–0.07)
Basophils Absolute: 0.1 10*3/uL (ref 0.0–0.1)
Basophils Relative: 1 %
Eosinophils Absolute: 0.3 10*3/uL (ref 0.0–0.5)
Eosinophils Relative: 5 %
HCT: 42 % (ref 36.0–46.0)
Hemoglobin: 13.7 g/dL (ref 12.0–15.0)
Immature Granulocytes: 0 %
Lymphocytes Relative: 24 %
Lymphs Abs: 1.4 10*3/uL (ref 0.7–4.0)
MCH: 32.9 pg (ref 26.0–34.0)
MCHC: 32.6 g/dL (ref 30.0–36.0)
MCV: 101 fL — ABNORMAL HIGH (ref 80.0–100.0)
Monocytes Absolute: 0.4 10*3/uL (ref 0.1–1.0)
Monocytes Relative: 8 %
Neutro Abs: 3.5 10*3/uL (ref 1.7–7.7)
Neutrophils Relative %: 62 %
Platelets: 174 10*3/uL (ref 150–400)
RBC: 4.16 MIL/uL (ref 3.87–5.11)
RDW: 12.8 % (ref 11.5–15.5)
WBC: 5.7 10*3/uL (ref 4.0–10.5)
nRBC: 0 % (ref 0.0–0.2)

## 2019-08-25 LAB — URINALYSIS, COMPLETE (UACMP) WITH MICROSCOPIC
Bilirubin Urine: NEGATIVE
Glucose, UA: NEGATIVE mg/dL
Hgb urine dipstick: NEGATIVE
Ketones, ur: 5 mg/dL — AB
Nitrite: NEGATIVE
Protein, ur: NEGATIVE mg/dL
Specific Gravity, Urine: 1.014 (ref 1.005–1.030)
WBC, UA: >50 WBC/hpf — ABNORMAL HIGH (ref 0–5)
pH: 5 (ref 5.0–8.0)

## 2019-08-25 LAB — LACTATE DEHYDROGENASE: LDH: 180 U/L (ref 98–192)

## 2019-08-25 LAB — SEDIMENTATION RATE: Sed Rate: 0 mm/hr (ref 0–22)

## 2019-08-25 LAB — HIV ANTIBODY (ROUTINE TESTING W REFLEX): HIV Screen 4th Generation wRfx: NONREACTIVE

## 2019-08-25 LAB — HEPATITIS B CORE ANTIBODY, TOTAL: Hep B Core Total Ab: NONREACTIVE

## 2019-08-25 LAB — HEPATITIS B SURFACE ANTIGEN: Hepatitis B Surface Ag: NONREACTIVE

## 2019-08-25 NOTE — Progress Notes (Signed)
HEMATOLOGY/ONCOLOGY CONSULTATION NOTE  Date of Service: 08/25/2019  Patient Care Team: Fanny Bien, MD as PCP - General (Family Medicine)  CHIEF COMPLAINTS/PURPOSE OF CONSULTATION:  Fever/M-Spike  HISTORY OF PRESENTING ILLNESS:   Shannon Woodard is a wonderful 54 y.o. female who has been referred to [place] by Dr Leigh Aurora for evaluation and management of fever/M-spike. Pt is accompanied today by her female friend. The pt reports that she is doing well overall.   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 To control her RA pt has previously has tried Methotrexate which caused low WBC, and Arava which caused significant hair loss. Pt has been using Rituxan since 2014-2015. Her RA has been relatively stable since beginning Rituxan and has only had a few breakthroughs. 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 They treated the pt with plasmaelectropheresis and IVIG, but it was ultimately Vincristine which restored her kidney function. She spent about 5 months in the hospital in total. 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   Pt's current symptoms include low-level fever, left chest pain, left groin pain and a persistent vaginal infection.   In February/March of 2020 pt began to have excessive vaginal bleeding. She had to have a D&C and they attempted an endometrial ablation but were unable to finish. She was placed on a progesterone-only birth control pill around May which has improved her vaginal bleeding somewhat. Pt has also had bacterial vaginosis that has not seemed to go away. She has been treated with Flagyl and Moxifloxacin but neither has gotten rid of the infection.   Pt has been having  a intermittent, low-level fevers that began in July. At this time she also began having right arm discomfort and had a nodule appear behind her right left knee. She thought that this was a RA flare so she was ordered a short-course of Prednisone. The Prednisone caused the right arm, nodule and fevers to disappear. The fever returned sometime after she discontinued the steroids, the other symptoms did not return. The fever begins as soon as she gets out of bed and becomes active. Her temperature has gotten as high as 100.2 F. Due to the continued elevated temperatures her Rheumatologist stopped pt from taking Rituxan in January. Since discontinuing Rituxan, pt has been taking a Tylenol every morning to reduce fevers.Over the past couple of days she has not had an elevated temperature and is unsure what has changed.   Pt has been on Topamax for over 10 years to help with her migraine headaches. She also began taking some medications within the last few weeks to help with her hair loss. She denies any history of thyroid conditions or any travel outside of [place] in the last five years. Pt has lived in [place] and [place] and has been in [place] for the last five years. She has a cat that bit her about 4-5 months ago.   Most recent lab results (08/11/2019) of CBC w/diff and CMP is as follows: all values are WNL except for MCH at 33.6, A/G Ratio at 3.4, Total Globulin at 1.4, Chloride at 108, Sodium at 146.  08/11/2019 SPEP is as follows: all values are WNL except for M-Spike  at 0.1, A/G Ratio at 2.0, Gamma Globulin at 0.2. 08/11/2019 Sed Rate at 2  08/11/2019 C-reactive protein at 2  On review of systems, pt reports fevers, left groin pain, left underarm pain, persistent bacterial infection and denies SOB, chest pain, leg pain/swelling, bowel habit changes, fatigue, dental pain and any other symptoms.   On PMHx the pt reports Hx of Kidney Failure, Dermatopolymyositis, Migraines, Rheumatoid  arthritis, TTP syndrome, PRES syndrome.  MEDICAL HISTORY:  Past Medical History:  Diagnosis Date  . Chronic kidney disease    hx kidney failure- resolved  . 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 Headache    migraines  . Hypertension   . Pancreatitis   . Raynauds phenomenon   . Rheumatoid arthritis ([place])   . T.T.P. syndrome ([place])    resolved    SURGICAL HISTORY: Past Surgical History:  Procedure Laterality Date  . [place]  . ROTATOR CUFF REPAIR Left 2016  . SINUS SURGERY WITH INSTATRAK  2006    SOCIAL HISTORY: Social History   Socioeconomic History  . Marital status: Divorced    Spouse name: Not on file  . Number of children: 2  . Years of education: 3  . Highest education level: Not on file  Occupational History    Comment: Corporate treasurer  Tobacco Use  . Smoking status: Never Smoker  Substance and Sexual Activity  . Alcohol use: Yes    Comment: rarely  . Drug use: No  . Sexual activity: Not on file  Other Topics Concern  . Not on file  Social History Narrative   Lives at home with sister, brother-in-law; has significant other   caffeine use- coffee, 1-2 cups daily    Social Determinants of Health   Financial Resource Strain:   . Difficulty of Paying Living Expenses: Not on file  Food Insecurity:   . Worried About Charity fundraiser in the Last Year: Not on file  . Ran Out of Food in the Last Year: Not on file  Transportation Needs:   . Lack of Transportation (Medical): Not on file  . Lack of Transportation (Non-Medical): Not on file  Physical Activity:   . Days of Exercise per Week: Not on file  . Minutes of Exercise per Session: Not on file  Stress:   . Feeling of Stress : Not on file  Social Connections:   . Frequency of Communication with Friends and Family: Not on file  . Frequency of Social Gatherings with Friends and Family: Not on file  . Attends Religious Services: Not on file  . Active Member of  Clubs or Organizations: Not on file  . Attends Archivist Meetings: Not on file  . Marital Status: Not on file  Intimate Partner Violence:   . Fear of Current or Ex-Partner: Not on file  . Emotionally Abused: Not on file  . Physically Abused: Not on file  . Sexually Abused: Not on file    FAMILY HISTORY: Family History  Problem Relation Age of Onset  . Cancer Mother        brain  . Thyroid disease Sister   . Diabetes Brother   . Alzheimer's disease Paternal Grandmother   . Cancer Paternal Grandfather        bone cancer    ALLERGIES:  is allergic to dilantin [phenytoin sodium extended].  MEDICATIONS:  Current Outpatient Medications  Medication Sig Dispense Refill  . albuterol (PROVENTIL HFA;VENTOLIN HFA) 108 (90 BASE)  MCG/ACT inhaler Inhale 1-2 puffs into the lungs every 6 (six) hours as needed for wheezing or shortness of breath. 1 Inhaler 0  . amLODipine (NORVASC) 10 MG tablet Take 10 mg by mouth daily.    Marland Kitchen aspirin-acetaminophen-caffeine (EXCEDRIN MIGRAINE) 250-250-65 MG tablet Take by mouth every 6 (six) hours as needed for headache.    . Aspirin-Acetaminophen-Caffeine (GOODY HEADACHE PO) Take by mouth. As needed for HA, migraine    . fluticasone (FLONASE) 50 MCG/ACT nasal spray Place 2 sprays into both nostrils daily. 16 g 0  . gabapentin (NEURONTIN) 100 MG capsule Take 1-3 capsules (100-300 mg total) by mouth at bedtime. 90 capsule 6  . omeprazole (PRILOSEC) 40 MG capsule Take 1 capsule by mouth daily.  2  . riTUXimab (RITUXAN) 500 MG/50ML injection Inject into the vein. 07/03/15 every 6 months for RA    . SUMAtriptan (IMITREX) 20 MG/ACT nasal spray Place 1 spray (20 mg total) into the nose every 2 (two) hours as needed for migraine or headache. May repeat in 2 hours if headache persists or recurs. 6 Inhaler 6  . topiramate (TOPAMAX) 100 MG tablet Take 1 tablet (100 mg total) by mouth 2 (two) times daily. 180 tablet 4   No current facility-administered medications  for this visit.    REVIEW OF SYSTEMS:    10 Point review of Systems was done is negative except as noted above.  PHYSICAL EXAMINATION: ECOG PERFORMANCE STATUS: 1 - Symptomatic but completely ambulatory  . Vitals:   08/25/19 1317  BP: 121/81  Pulse: 87  Resp: 16  Temp: 98 F (36.7 C)  SpO2: 98%   Filed Weights   08/25/19 1317  Weight: 112 lb 11.2 oz (51.1 kg)   .There is no height or weight on file to calculate BMI.  GENERAL:alert, in no acute distress and comfortable SKIN: no acute rashes, no significant lesions EYES: conjunctiva are pink and non-injected, sclera anicteric OROPHARYNX: MMM, no exudates, no oropharyngeal erythema or ulceration NECK: supple, no JVD LYMPH:  no palpable lymphadenopathy in the cervical, axillary or inguinal regions LUNGS: clear to auscultation b/l with normal respiratory effort HEART: regular rate & rhythm ABDOMEN:  normoactive bowel sounds , non tender, not distended. Extremity: no pedal edema PSYCH: alert & oriented x 3 with fluent speech NEURO: no focal motor/sensory deficits  LABORATORY DATA:  I have reviewed the data as listed  . CBC Latest Ref Rng & Units 08/25/2019 05/27/2015  WBC 4.0 - 10.5 K/uL 5.7 5.1  Hemoglobin 12.0 - 15.0 g/dL 13.7 13.8  Hematocrit 36.0 - 46.0 % 42.0 39.8  Platelets 150 - 400 K/uL 174 164    . CMP Latest Ref Rng & Units 08/25/2019 05/27/2015  Glucose 70 - 99 mg/dL 85 83  BUN 6 - 20 mg/dL 15 15  Creatinine 0.44 - 1.00 mg/dL 0.87 0.99  Sodium 135 - 145 mmol/L 143 139  Potassium 3.5 - 5.1 mmol/L 3.7 3.4(L)  Chloride 98 - 111 mmol/L 109 111  CO2 22 - 32 mmol/L 25 23  Calcium 8.9 - 10.3 mg/dL 9.1 8.8(L)  Total Protein 6.5 - 8.1 g/dL 6.7 6.6  Total Bilirubin 0.3 - 1.2 mg/dL 0.3 0.5  Alkaline Phos 38 - 126 U/L 74 62  AST 15 - 41 U/L 21 21  ALT 0 - 44 U/L 18 18     RADIOGRAPHIC STUDIES: I have personally reviewed the radiological images as listed and agreed with the findings in the report. No results  found.  ASSESSMENT & PLAN:  1. Fever of unknown origin for 9 months  2. M Protein on SPEP at 0.1 g/dL  PLAN: -Discussed patient's most recent labs from 08/11/2019,  all values are WNL except for [place] at 33.6, A/G Ratio at 3.4, Total Globulin at 1.4, Chloride at 108, Sodium at 146.  -Discussed 08/11/2019 SPEP is as follows: all values are WNL except for M-Spike at 0.1, A/G Ratio at 2.0, Gamma Globulin at 0.2. -Discussed 08/11/2019 Sed Rate at 2  -Discussed 08/11/2019 C-reactive protein at 2 -No lymphadenopathy, or splenomegaly on clinical exam. No new bone pains.  -Advised pt that her M Protein level is not unreasonable significantly elevated  considering her autoimmune conditions  -Advised pt that pt with autoimmune conditions can have baseline increase in globulins due to chronic inflammation -Advised pt that her elevated body temperatures are not likely caused by a BM problem or blood disorder  -Advised pt that her high temperatures could be caused by medications, certain infections, or other inflammatory disorders - progesterone birth control most likely due to thermogenic effects, Topomax also possible but pt has been on it for 10+ years -Advised pt that the duration of her elevated temperatures r/o more concerning possibilities such as high-grade lymphoma or TTP recurrence -Advised pt that there is no contraindication to take the COVID19 vaccine from a hematologic standpoint. Recommend pt discuss with Dr. Amil Amen  -Recommend pt discuss medications with Dr. Amil Amen or PCP  -Will defer to Dr. Amil Amen for Infectious Disease Specialist referral -Will get labs today -Will see back in 2 weeks via phone    FOLLOW UP: Labs today Phone visit with Dr Irene Limbo in 2 weeks  All of the patients questions were answered with apparent satisfaction. The patient knows to call the clinic with any problems, questions or concerns.  I spent 30 mins counseling the patient face to face. The total time spent  in the appointment was 40 minutes and more than 50% was on counseling and direct patient cares.    Sullivan Lone MD [place] AAHIVMS [facility] Hematology/Oncology Physician [facility]  (Office):       [phone] (Work cell):  [phone] (Fax):           [phone]  08/25/2019 6:27 AM  I, Yevette Edwards, am acting as a scribe for Dr. Sullivan Lone.   .I have reviewed the above documentation for accuracy and completeness, and I agree with the above. Brunetta Genera MD

## 2019-08-30 LAB — MULTIPLE MYELOMA PANEL, SERUM
Albumin SerPl Elph-Mcnc: 4.4 g/dL (ref 2.9–4.4)
Albumin/Glob SerPl: 2.1 — ABNORMAL HIGH (ref 0.7–1.7)
Alpha 1: 0.3 g/dL (ref 0.0–0.4)
Alpha2 Glob SerPl Elph-Mcnc: 0.8 g/dL (ref 0.4–1.0)
B-Globulin SerPl Elph-Mcnc: 0.9 g/dL (ref 0.7–1.3)
Gamma Glob SerPl Elph-Mcnc: 0.2 g/dL — ABNORMAL LOW (ref 0.4–1.8)
Globulin, Total: 2.1 g/dL — ABNORMAL LOW (ref 2.2–3.9)
IgA: 7 mg/dL — ABNORMAL LOW (ref 87–352)
IgG (Immunoglobin G), Serum: 185 mg/dL — ABNORMAL LOW (ref 586–1602)
IgM (Immunoglobulin M), Srm: 38 mg/dL (ref 26–217)
Total Protein ELP: 6.5 g/dL (ref 6.0–8.5)

## 2019-09-01 ENCOUNTER — Telehealth: Payer: Self-pay | Admitting: Hematology

## 2019-09-01 NOTE — Telephone Encounter (Signed)
Scheduled per 03/10 los, patient has been called and notified.

## 2019-09-06 NOTE — Progress Notes (Signed)
HEMATOLOGY/ONCOLOGY CONSULTATION NOTE  Date of Service: 09/08/2019  Patient Care Team: Kathyrn Lass, MD as PCP - General (Family Medicine)  CHIEF COMPLAINTS/PURPOSE OF CONSULTATION:  Fever/M-Spike  HISTORY OF PRESENTING ILLNESS:    Shannon Woodard is a wonderful 54 y.o. female who has been referred to [place] by Dr Leigh Aurora for evaluation and management of fever/M-spike. Pt is accompanied today by her female friend. The pt reports that she is doing well overall.   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 To control her RA pt has previously has tried Methotrexate which caused low WBC, and Arava which caused significant hair loss. Pt has been using Rituxan since 2014-2015. Her RA has been relatively stable since beginning Rituxan and has only had a few breakthroughs. 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 They treated the pt with plasmaelectropheresis and IVIG, but it was ultimately Vincristine which restored her kidney function. She spent about 5 months in the hospital in total. 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   Pt's current symptoms include low-level fever, left chest pain, left groin pain and a persistent vaginal infection.   In February/March of 2020 pt began to have excessive vaginal bleeding. She had to have a D&C and they attempted an endometrial ablation but were unable to finish. She was placed on a progesterone-only birth control pill around May which has improved her vaginal bleeding somewhat. Pt has also had bacterial vaginosis that has not seemed to go away. She has been treated with Flagyl and Moxifloxacin but neither has gotten rid of the infection.   Pt has been having a  intermittent, low-level fevers that began in July. At this time she also began having right arm discomfort and had a nodule appear behind her right left knee. She thought that this was a RA flare so she was ordered a short-course of Prednisone. The Prednisone caused the right arm, nodule and fevers to disappear. The fever returned sometime after she discontinued the steroids, the other symptoms did not return. The fever begins as soon as she gets out of bed and becomes active. Her temperature has gotten as high as 100.2 F. Due to the continued elevated temperatures her Rheumatologist stopped pt from taking Rituxan in January. Since discontinuing Rituxan, pt has been taking a Tylenol every morning to reduce fevers.Over the past couple of days she has not had an elevated temperature and is unsure what has changed.   Pt has been on Topamax for over 10 years to help with her migraine headaches. She also began taking some medications within the last few weeks to help with her hair loss. She denies any history of thyroid conditions or any travel outside of [place] in the last five years. Pt has lived in [place] and [place] and has been in [place] for the last five years. She has a cat that bit her about 4-5 months ago.   Most recent lab results (08/11/2019) of CBC w/diff and CMP is as follows: all values are WNL except for MCH at 33.6, A/G Ratio at 3.4, Total Globulin at 1.4, Chloride at 108, Sodium at 146.  08/11/2019 SPEP is as follows: all values are WNL except for M-Spike  at 0.1, A/G Ratio at 2.0, Gamma Globulin at 0.2. 08/11/2019 Sed Rate at 2  08/11/2019 C-reactive protein at 2  On review of systems, pt reports fevers, left groin pain, left underarm pain, persistent bacterial infection and denies SOB, chest pain, leg pain/swelling, bowel habit changes, fatigue, dental pain and any other symptoms.   On PMHx the pt reports Hx of Kidney Failure, Dermatopolymyositis, Migraines, Rheumatoid arthritis,  TTP syndrome, PRES syndrome.  INTERVAL HISTORY:   I connected with  Shannon Woodard on 09/08/19 by telephone and verified that I am speaking with the correct person using two identifiers.   I discussed the limitations of evaluation and management by telemedicine. The patient expressed understanding and agreed to proceed.  Other persons participating in the visit and their role in the encounter:        -Yevette Edwards, Medical Scribe  Patient's location: Home Provider's location: [place] [facility] is a wonderful 54 y.o. female who is here or evaluation and management of fever/M-spike. The patient's last visit with [place] was on 08/25/2019. The pt reports that she is doing well overall.  The pt reports that she has had a couple of days that she had no fever at all. She denies any symptomatic fevers including chills or night sweats. Her temperature today is at 99.59F. She discontinued using her progesterone birth control the day after our last visit. Pt believes that her overall temperatures have decreased since discontinuing her birth control.  She has been having a new pain in the back of her neck that is tender to the touch. She has not had any urinary symptoms lately. Pt has previously been concerned that she was passing a kidney stones. Pt has had about 3 UTIs in the last five years and believes that she was treated with Bactrim previously.   Lab results (08/25/19) of CBC w/diff and CMP is as follows: all values are WNL except for MCV at 101.0  08/25/2019 LDH at 180 08/25/2019 Sed Rate at 0 08/25/2019 HCV Ab is "Non Reactive" 08/25/2019 Hepatitis B Surface Ag is "Non Reactive"  08/25/2019 Hep B Core Total Ab is "Non Reactive" 08/25/2019 HIV antibody is "Non Reactive" 08/25/2019 Urinalysis shows all values are WNL except for Ketones at 5, Leukocytes at "Moderate", WBC at >50, Bacteria at "Rare". 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   MEDICAL HISTORY:  Past Medical History:  Diagnosis Date  . Chronic kidney disease    hx kidney failure- resolved  . 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 Headache    migraines  . Hypertension   . Pancreatitis   . Raynauds phenomenon   . Rheumatoid arthritis ([place])   . T.T.P. syndrome ([place])    resolved    SURGICAL HISTORY: Past Surgical History:  Procedure Laterality Date  . [place]  . ROTATOR CUFF REPAIR Left 2016  . SINUS SURGERY WITH INSTATRAK  2006    SOCIAL HISTORY: Social History   Socioeconomic History  . Marital status: Divorced    Spouse name: Not on file  . Number of children: 2  . Years of education:  16  . Highest education level: Not on file  Occupational History    Comment: [employer]  Tobacco Use  . Smoking status: Never Smoker  Substance and Sexual Activity  . Alcohol use: Yes    Comment: rarely  . Drug use: No  . Sexual activity: Not on file  Other Topics Concern  . Not on file  Social History Narrative   Lives at home with sister, brother-in-law; has significant other   caffeine use- coffee, 1-2 cups daily    Social Determinants of Health   Financial Resource Strain:   . Difficulty of Paying Living Expenses:   Food Insecurity:   . Worried About Charity fundraiser in the Last Year:   . Arboriculturist in the Last Year:   Transportation Needs:   . Film/video editor (Medical):   Marland Kitchen Lack of Transportation (Non-Medical):   Physical Activity:   . Days of Exercise per Week:   . Minutes of Exercise per Session:   Stress:   . Feeling of Stress :   Social Connections:   . Frequency of  Communication with Friends and Family:   . Frequency of Social Gatherings with Friends and Family:   . Attends Religious Services:   . Active Member of Clubs or Organizations:   . Attends Archivist Meetings:   Marland Kitchen Marital Status:   Intimate Partner Violence:   . Fear of Current or Ex-Partner:   . Emotionally Abused:   Marland Kitchen Physically Abused:   . Sexually Abused:     FAMILY HISTORY: Family History  Problem Relation Age of Onset  . Cancer Mother        brain  . Thyroid disease Sister   . Diabetes Brother   . Alzheimer's disease Paternal Grandmother   . Cancer Paternal Grandfather        bone cancer    ALLERGIES:  is allergic to doxycycline; dilantin  [phenytoin]; and dilantin [phenytoin sodium extended].  MEDICATIONS:  Current Outpatient Medications  Medication Sig Dispense Refill  . acetaminophen (TYLENOL) 325 MG tablet Take 650 mg by mouth as needed. Takes daily for elevated temperature    . albuterol (PROVENTIL HFA;VENTOLIN HFA) 108 (90 BASE) MCG/ACT inhaler Inhale 1-2 puffs into the lungs every 6 (six) hours as needed for wheezing or shortness of breath. 1 Inhaler 0  . amLODipine (NORVASC) 10 MG tablet Take 10 mg by mouth daily.    . Ascorbic Acid (VITAMIN C PO) Take 1 tablet by mouth daily.    Marland Kitchen BIOTIN PO Take by mouth daily.    . Butalbital-APAP-Caffeine 50-300-40 MG CAPS butalbital-acetaminophen-caffeine 50 mg-300 mg-40 mg capsule    . Calcium-Phosphorus-Vitamin D (CALCIUM GUMMIES PO) Take by mouth daily.    . cefpodoxime (VANTIN) 100 MG tablet Take 1 tablet (100 mg total) by mouth 2 (two) times daily for 7 days. 14 tablet 0  . Cyanocobalamin (VITAMIN B 12 PO) Take 1 tablet by mouth daily.    Marland Kitchen escitalopram (LEXAPRO) 10 MG tablet Take 10 mg by mouth daily.    . fluticasone (FLONASE) 50 MCG/ACT nasal spray Place 2 sprays into both nostrils daily. 16 g 0  . gabapentin (NEURONTIN) 100 MG capsule Take 1-3 capsules (100-300 mg total) by mouth at bedtime. (Patient not  taking: Reported on 08/25/2019) 90 capsule 6  . ibuprofen (ADVIL) 200 MG tablet Take 200 mg by mouth as needed. Takes daily for elevated temperature    . naproxen sodium (ALEVE) 220 MG tablet  Take 220 mg by mouth as needed. Takes daily for elevated temperature    . norethindrone (MICRONOR) 0.35 MG tablet Take 1 tablet by mouth daily.    Marland Kitchen omeprazole (PRILOSEC) 40 MG capsule Take 1 capsule by mouth daily.  2  . OVER THE COUNTER MEDICATION Viviscal Hair Growth Supplement - alternates days with Foligrowth    . OVER THE COUNTER MEDICATION Foligrowth - alternates with Viviscal    . OVER THE COUNTER MEDICATION Take by mouth daily. Probiotic with Prebiotic    . Prenat-FePoly-Metf-FA-DHA-DSS (VITAFOL FE+) 90-1-200 & 50 MG CPPK Take 1 tablet by mouth daily.    . riTUXimab (RITUXAN) 500 MG/50ML injection Inject into the vein. 07/03/15 every 6 months for RA    . SUMAtriptan (IMITREX) 20 MG/ACT nasal spray Place 1 spray (20 mg total) into the nose every 2 (two) hours as needed for migraine or headache. May repeat in 2 hours if headache persists or recurs. 6 Inhaler 6  . topiramate (TOPAMAX) 100 MG tablet Take 1 tablet (100 mg total) by mouth 2 (two) times daily. 180 tablet 4  . traMADol (ULTRAM) 50 MG tablet Take 50 mg by mouth 2 (two) times daily as needed.    . Vitamin D, Ergocalciferol, (DRISDOL) 1.25 MG (50000 UNIT) CAPS capsule Take 50,000 Units by mouth every 7 (seven) days.     No current facility-administered medications for this visit.    REVIEW OF SYSTEMS:   A 10+ POINT REVIEW OF SYSTEMS WAS OBTAINED including neurology, dermatology, psychiatry, cardiac, respiratory, lymph, extremities, GI, GU, Musculoskeletal, constitutional, breasts, reproductive, HEENT.  All pertinent positives are noted in the HPI.  All others are negative.   PHYSICAL EXAMINATION:  Telehealth visit  LABORATORY DATA:  I have reviewed the data as listed  . CBC Latest Ref Rng & Units 08/25/2019 05/27/2015  WBC 4.0 - 10.5  K/uL 5.7 5.1  Hemoglobin 12.0 - 15.0 g/dL 13.7 13.8  Hematocrit 36.0 - 46.0 % 42.0 39.8  Platelets 150 - 400 K/uL 174 164    . CMP Latest Ref Rng & Units 08/25/2019 05/27/2015  Glucose 70 - 99 mg/dL 85 83  BUN 6 - 20 mg/dL 15 15  Creatinine 0.44 - 1.00 mg/dL 0.87 0.99  Sodium 135 - 145 mmol/L 143 139  Potassium 3.5 - 5.1 mmol/L 3.7 3.4(L)  Chloride 98 - 111 mmol/L 109 111  CO2 22 - 32 mmol/L 25 23  Calcium 8.9 - 10.3 mg/dL 9.1 8.8(L)  Total Protein 6.5 - 8.1 g/dL 6.7 6.6  Total Bilirubin 0.3 - 1.2 mg/dL 0.3 0.5  Alkaline Phos 38 - 126 U/L 74 62  AST 15 - 41 U/L 21 21  ALT 0 - 44 U/L 18 18     RADIOGRAPHIC STUDIES: I have personally reviewed the radiological images as listed and agreed with the findings in the report. No results found.  ASSESSMENT & PLAN:   1. Fever of unknown origin for 9 months - could have been from progesterone use and possible UTI 2. M Protein on SPEP at 0.1 g/dL  PLAN: -Discussed pt labwork, 08/25/19; blood counts and chemistries are normal. LDH, Sed Rate, HCV Ab, Hepatitis B Surface Ag, Hep B Core Total Ab, and HIV antibody are all normal.  -Discussed 08/25/2019 Urinalysis shows all values are WNL except for Ketones at 5, Leukocytes at "Moderate", WBC at >50, Bacteria at "Rare". 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 Advised pt that hypogammaglobulinemia can increase risk of infection, especially rarer infections.  -Advised pt that chronic hypogammaglobulinemia can also increase the risk of immune dysregulation, which can then increase the risk of certain tumors including lymphomas. -Advised pt that if indicated could use IVIG for IgG replacement in setting of severe  infections or unusual infections. Will defer to Dr. Amil Amen to use if necessary.  -MMP is not concerning for a plasma cell dyscrasia  -Elevated temperatures have improved. Recommend keep an eye on them and contact PCP if they begin to rise again.  -Advised pt again that temperatures are most accurate first thing in the morning and fever is not considered clinically relevant unless >= 100.47F -Advised pt that the pain in the back of her neck sounds muscular in nature and is unlikely due to a blood clot  -Discussed getting a urine culture vs treating empirically with antibiotics for elevated Leukocytes in the urine -- prescribed vantin without culture results per patients preference. -Advised pt that elevated Leukocytes in the urine could also be caused by inflammation of the kidneys - not the most likely -Advised pt that Ketones in the blood suggest nutritional deficiencies. Recommend pt eat well.  -Recommend pt receive the COVID19 vaccine after antibiotics are complete and after consulting Dr. Amil Amen.  -Recommend pt f/u with Dr. Amil Amen or Dr. Sabra Heck for repeat urine culture in a few weeks -Rx Vantin  -Will see back as needed   FOLLOW UP: RTC with Dr. Irene Limbo as needed  Continue care with primary care physician   The total time spent in the appt was 20 minutes and more than 50% was on counseling and direct patient cares.  All of the patient's questions were answered with apparent satisfaction. The patient knows to call the clinic with any problems, questions or concerns.    Sullivan Lone MD [place] AAHIVMS [facility] Hematology/Oncology Physician [facility]  (Office):       [phone] (Work cell):  [phone] (Fax):           [phone]  09/08/2019 4:55 PM  I, Yevette Edwards, am acting as a scribe for Dr. Sullivan Lone.   .I have reviewed the above documentation for accuracy and completeness, and I agree with the above. Brunetta Genera MD

## 2019-09-08 ENCOUNTER — Inpatient Hospital Stay (HOSPITAL_BASED_OUTPATIENT_CLINIC_OR_DEPARTMENT_OTHER): Payer: 59 | Admitting: Hematology

## 2019-09-08 DIAGNOSIS — N390 Urinary tract infection, site not specified: Secondary | ICD-10-CM

## 2019-09-08 DIAGNOSIS — R509 Fever, unspecified: Secondary | ICD-10-CM | POA: Diagnosis not present

## 2019-09-08 DIAGNOSIS — D801 Nonfamilial hypogammaglobulinemia: Secondary | ICD-10-CM | POA: Diagnosis not present

## 2019-09-08 MED ORDER — CEFPODOXIME PROXETIL 100 MG PO TABS: 100.0000 mg | ORAL_TABLET | Freq: Two times a day (BID) | ORAL | 0 refills | Status: AC

## 2019-09-22 ENCOUNTER — Telehealth: Payer: Self-pay | Admitting: Hematology

## 2019-09-22 NOTE — Telephone Encounter (Signed)
Per pt to fax office notes to Dr. Amil Amen [phone]

## 2019-10-26 ENCOUNTER — Encounter: Payer: Self-pay | Admitting: Cardiology

## 2019-10-26 ENCOUNTER — Encounter: Payer: Self-pay | Admitting: *Deleted

## 2019-10-26 ENCOUNTER — Ambulatory Visit: Payer: 59 | Admitting: Cardiology

## 2019-10-26 ENCOUNTER — Other Ambulatory Visit: Payer: Self-pay

## 2019-10-26 VITALS — BP 118/73 | HR 78 | Ht 62.0 in | Wt 116.6 lb

## 2019-10-26 DIAGNOSIS — Z8249 Family history of ischemic heart disease and other diseases of the circulatory system: Secondary | ICD-10-CM | POA: Diagnosis not present

## 2019-10-26 DIAGNOSIS — I10 Essential (primary) hypertension: Secondary | ICD-10-CM | POA: Diagnosis not present

## 2019-10-26 DIAGNOSIS — R002 Palpitations: Secondary | ICD-10-CM | POA: Diagnosis not present

## 2019-10-26 DIAGNOSIS — Z7189 Other specified counseling: Secondary | ICD-10-CM

## 2019-10-26 NOTE — Progress Notes (Signed)
Cardiology Office Note:    Date:  10/26/2019   ID:  [place], [place] May 23, 1966, MRN [id number]  PCP:  Kathyrn Lass, MD  Cardiologist:  Buford Dresser, MD  Referring MD: Kathyrn Lass, MD   CC: new patient consultation to evaluate for CV risk  History of Present Illness:    Shannon Woodard is a 54 y.o. female with a hx of dermatomyositis, rheumatoid arthritis, hypertension who is seen as a new consult at the request of Kathyrn Lass, MD for the evaluation and management of CV risk given history of rheumatoid arthritis.  Note from 03/01/2019 with Dr. Sabra Heck reviewed. Per the note, she was referred to cardiology due to longstanding history of rheumatoid arthritis and need to discuss cardiovascular risk.  Cardiovascular risk factors: Prior clinical ASCVD: none Comorbid conditions: Endorses hypertension, borderline hyperlipidemia. 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 Was on steroids for several years in the last 21s, then on imuran, then rituxin with remission of her dermatomyositis. Rare use of steroids since then Tobacco use history: never Family history: mother had SVT. No one with MI, heart failure. Brother may have had a stroke several years ago, had diabetes and other health issues. Prior cardiac testing and/or incidental findings on other testing: none that she knows of Exercise level: can climb stairs, etc without issue, but minimal intentional exercise recently due to daily fevers. Current diet: lots of fruits and vegetables, eats healthy.  Had prior history of strange chest sensations. Wore a monitor for 48 hours in the past, no events. Had recurrent episodes since starting lexapro, she is weaning off of this with  improvement. Occurring now 2-3 times/day, very brief typically, rarely sustained. Sometimes has to cough to make it stop. Feels like a squeeze that lasts a second and then relaxes.  Had scarlet fever as a child, thinks she was hospitalized. Had distant echo. Told her murmur was benign.  Has had a daily fever since January, working with her rheumatologist on this.  Past Medical History:  Diagnosis Date  . Chronic kidney disease    hx kidney failure- resolved  . 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 Headache    migraines  . Hypertension   . Pancreatitis   . Raynauds phenomenon   . Rheumatoid arthritis ([place])   . 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 [place]  . ROTATOR CUFF REPAIR Left 2016  . SINUS SURGERY WITH INSTATRAK  2006    Current Medications: Current Outpatient Medications on File Prior to Visit  Medication Sig  . acetaminophen (TYLENOL) 325 MG tablet Take 650 mg by mouth as needed. Takes daily for elevated temperature  . albuterol (PROVENTIL HFA;VENTOLIN HFA) 108 (90 BASE) MCG/ACT inhaler Inhale 1-2 puffs into the lungs every 6 (six) hours as needed for wheezing or shortness of breath.  Marland Kitchen amLODipine (NORVASC) 10 MG tablet Take 10 mg by mouth daily.  . Ascorbic Acid (VITAMIN C PO) Take 1 tablet by mouth daily.  Marland Kitchen BIOTIN PO Take by mouth daily.  . Butalbital-APAP-Caffeine 50-300-40 MG CAPS butalbital-acetaminophen-caffeine 50 mg-300 mg-40 mg capsule  . Calcium-Phosphorus-Vitamin D (CALCIUM GUMMIES PO) Take by mouth daily.  . Cyanocobalamin (VITAMIN B 12 PO) Take 1  tablet by mouth daily.  Marland Kitchen escitalopram (LEXAPRO) 10 MG tablet Take 10 mg by mouth daily.  . fluticasone (FLONASE) 50 MCG/ACT nasal spray Place 2 sprays into both nostrils daily.  Marland Kitchen gabapentin (NEURONTIN) 100 MG capsule Take 1-3 capsules (100-300 mg total) by mouth at bedtime. (Patient not taking: Reported on 08/25/2019)  .  ibuprofen (ADVIL) 200 MG tablet Take 200 mg by mouth as needed. Takes daily for elevated temperature  . naproxen sodium (ALEVE) 220 MG tablet Take 220 mg by mouth as needed. Takes daily for elevated temperature  . norethindrone (MICRONOR) 0.35 MG tablet Take 1 tablet by mouth daily.  Marland Kitchen omeprazole (PRILOSEC) 40 MG capsule Take 1 capsule by mouth daily.  Marland Kitchen OVER THE COUNTER MEDICATION Viviscal Hair Growth Supplement - alternates days with Foligrowth  . OVER THE COUNTER MEDICATION Foligrowth - alternates with Viviscal  . OVER THE COUNTER MEDICATION Take by mouth daily. Probiotic with Prebiotic  . Prenat-FePoly-Metf-FA-DHA-DSS (VITAFOL FE+) 90-1-200 & 50 MG CPPK Take 1 tablet by mouth daily.  . riTUXimab (RITUXAN) 500 MG/50ML injection Inject into the vein. 07/03/15 every 6 months for RA  . SUMAtriptan (IMITREX) 20 MG/ACT nasal spray Place 1 spray (20 mg total) into the nose every 2 (two) hours as needed for migraine or headache. May repeat in 2 hours if headache persists or recurs.  . topiramate (TOPAMAX) 100 MG tablet Take 1 tablet (100 mg total) by mouth 2 (two) times daily.  . traMADol (ULTRAM) 50 MG tablet Take 50 mg by mouth 2 (two) times daily as needed.  . Vitamin D, Ergocalciferol, (DRISDOL) 1.25 MG (50000 UNIT) CAPS capsule Take 50,000 Units by mouth every 7 (seven) days.   No current facility-administered medications on file prior to visit.     Allergies:   Doxycycline, Dilantin  [phenytoin], and Dilantin [phenytoin sodium extended]   Social History   Tobacco Use  . Smoking status: Never Smoker  Substance Use Topics  . Alcohol use: Yes    Comment: rarely  . Drug use: No    Family History: family history includes Alzheimer's disease in her paternal grandmother; Cancer in her mother and paternal grandfather; Diabetes in her brother; Thyroid disease in her sister.  ROS:   Please see the history of present illness.  Additional pertinent ROS: Constitutional: Negative for chills,  night sweats, unintentional weight loss. Positive for daily fever, working with rheumatologist on this HENT: Negative for ear pain and hearing loss.   Eyes: Negative for loss of vision and eye pain.  Respiratory: Negative for cough, sputum, wheezing.   Cardiovascular: See HPI. Gastrointestinal: Negative for abdominal pain, melena, and hematochezia.  Genitourinary: Negative for dysuria and hematuria.  Musculoskeletal: Negative for falls. Positive for chronic rheumatoid arthritis  Skin: Negative for itching and rash.  Neurological: Negative for focal weakness, focal sensory changes and loss of consciousness.  Endo/Heme/Allergies: Does not bruise/bleed easily.     EKGs/Labs/Other Studies Reviewed:    The following studies were reviewed today: No prior cardiac studies  EKG:  EKG is personally reviewed.  The ekg ordered today demonstrates NSR at 75 bpm  Recent Labs: 08/25/2019: ALT 18; BUN 15; Creatinine 0.87; Hemoglobin 13.7; Platelets 174; Potassium 3.7; Sodium 143  Recent Lipid Panel No results found for: CHOL, TRIG, HDL, CHOLHDL, VLDL, LDLCALC, LDLDIRECT  Physical Exam:    VS:  BP 118/73   Pulse 78   Ht 5\' 2"  (1.575 m)   Wt 116 lb 9.6 oz (52.9 kg)   SpO2 100%  BMI 21.33 kg/m     Wt Readings from Last 3 Encounters:  10/26/19 116 lb 9.6 oz (52.9 kg)  08/25/19 112 lb 11.2 oz (51.1 kg)  07/03/15 121 lb 6.4 oz (55.1 kg)    GEN: Well nourished, well developed in no acute distress HEENT: Normal, moist mucous membranes NECK: No JVD CARDIAC: regular rhythm, normal S1 and S2, no rubs or gallops. No murmurs. VASCULAR: Radial and DP pulses 2+ bilaterally. No carotid bruits RESPIRATORY:  Clear to auscultation without rales, wheezing or rhonchi  ABDOMEN: Soft, non-tender, non-distended MUSCULOSKELETAL:  Ambulates independently SKIN: Warm and dry, no edema NEUROLOGIC:  Alert and oriented x 3. No focal neuro deficits noted. PSYCHIATRIC:  Normal affect    ASSESSMENT:    1.  Palpitation   2. Family history of premature CAD   3. Essential hypertension   4. Cardiac risk counseling   5. Counseling on health promotion and disease prevention    PLAN:    Strange chest sensations, similar to palpitations, has to cough to improve it: concerning for an arrhythmia -will order 7 day zio  CV risk: with history of rheumatoid arthritis, she is at increased risk for cardiovascular disease -we reviewed the pros/cons of calcium scores. A cardiac CT scan for coronary calcium is a non-invasive way of obtaining information about the presence, location and extent of calcified plaque in the coronary arteries-the vessels that supply oxygen-containing blood to the heart muscle. Calcified plaque results when there is a build-up of fat and other substances under the inner layer of the artery. This material can calcify which signals the presence of atherosclerosis, a disease of the vessel wall, also called coronary artery disease.  People with this disease have an increased risk for heart attacks. In addition, over time, progression of plaque build up (CAD) can narrow the arteries or even close off blood flow to the heart. Because calcium is a marker of CAD, the amount of calcium detected on a cardiac CT scan is a helpful prognostic tool.  -we reviewed the charts together which show the relationship between calcium score and 15 year all cause mortality -after discussion, she is amenable to pursuing calcium score today  Discussed statins at length, she is hesistant about this. Discussed cardiovascular data for omega 3 fatty acids (lack of data for OTCs). Will readdress based on calcium score.  Hypertension: -blood pressure at goal today -continue amlodipine 10 mg daily  Cardiac risk counseling and prevention recommendations: has additional risk of family history of cardiovascular disease -recommend heart healthy/Mediterranean diet, with whole grains, fruits, vegetable, fish, lean meats,  nuts, and olive oil. Limit salt. -recommend moderate walking, 3-5 times/week for 30-50 minutes each session. Aim for at least 150 minutes.week. Goal should be pace of 3 miles/hours, or walking 1.5 miles in 30 minutes -recommend avoidance of tobacco products. Avoid excess alcohol. -Additional risk factor control:  -Diabetes risk: A1c is not available, denies history  -Lipids: per [facility] 07/30/17: Tchol 220, HDL 83, TG 86 (LDL not listed but elevated given total cholesterol)  -Blood pressure control: as above  -Weight: BMI 21 -ASCVD risk score: The ASCVD Risk score Mikey Bussing DC Jr., et al., 2013) failed to calculate for the following reasons:   Cannot find a previous HDL lab   Cannot find a previous total cholesterol lab    Plan for follow up: 2 years or sooner if testing abnormal  Buford Dresser, MD, PhD [place]  [facility]    Medication Adjustments/Labs and Tests Ordered: Current  medicines are reviewed at length with the patient today.  Concerns regarding medicines are outlined above.  Orders Placed This Encounter  Procedures  . CT CARDIAC SCORING  . LONG TERM MONITOR (3-14 DAYS)  . EKG 12-Lead   No orders of the defined types were placed in this encounter.   Patient Instructions  Medication Instructions:  Your Physician recommend you continue on your current medication as directed.    *If you need a refill on your cardiac medications before your next appointment, please call your pharmacy*   Lab Work: None  Testing/Procedures: Calcium Score [address]  [place] has recommended that you wear an 7  DAY ZIO-PATCH monitor. The Zio patch cardiac monitor continuously records heart rhythm data for up to 14 days, this is for patients being evaluated for multiple types heart rhythms. For the first 24 hours post application, please avoid getting the Zio monitor wet in the shower or by excessive sweating during exercise. After that, feel free to  carry on with regular activities. Keep soaps and lotions away from the ZIO XT Patch.   Someone from our office will call to have monitor mailed       Follow-Up: At [facility], you and your health needs are our priority.  As part of our continuing mission to provide you with exceptional heart care, we have created designated Provider Care Teams.  These Care Teams include your primary Cardiologist (physician) and Advanced Practice Providers (APPs -  Physician Assistants and Nurse Practitioners) who all work together to provide you with the care you need, when you need it.  We recommend signing up for the patient portal called "MyChart".  Sign up information is provided on this After Visit Summary.  MyChart is used to connect with patients for Virtual Visits (Telemedicine).  Patients are able to view lab/test results, encounter notes, upcoming appointments, etc.  Non-urgent messages can be sent to your provider as well.   To learn more about what you can do with MyChart, go to [email or web address].    Your next appointment:   2 year(s)  The format for your next appointment:   In Person  Provider:   Buford Dresser, MD  [place] Instructions   Your physician has requested you wear your ZIO patch monitor___7____days.   This is a single patch monitor.  [employer] supplies one patch monitor per enrollment.  Additional stickers are not available.   Please do not apply patch if you will be having a Nuclear Stress Test, Echocardiogram, Cardiac CT, MRI, or Chest Xray during the time frame you would be wearing the monitor. The patch cannot be worn during these tests.  You cannot remove and re-apply the ZIO XT patch monitor.   Your ZIO patch monitor will be sent USPS Priority mail from [facility] directly to your home address. The monitor may also be mailed to a PO BOX if home delivery is not available.   It may take 3-5 days to receive your monitor after you have  been enrolled.   Once you have received you monitor, please review enclosed instructions.  Your monitor has already been registered assigning a specific monitor serial # to you.   Applying the monitor   Shave hair from upper left chest.   Hold abrader disc by orange tab.  Rub abrader in 40 strokes over left upper chest as indicated in your monitor instructions.   Clean area with 4 enclosed alcohol pads .  Use all pads to assure are is cleaned thoroughly.  Let dry.   Apply patch as indicated in monitor instructions.  Patch will be place under collarbone on left side of chest with arrow pointing upward.   Rub patch adhesive wings for 2 minutes.Remove white label marked "1".  Remove white label marked "2".  Rub patch adhesive wings for 2 additional minutes.   While looking in a mirror, press and release button in center of patch.  A small green light will flash 3-4 times .  This will be your only indicator the monitor has been turned on.     Do not shower for the first 24 hours.  You may shower after the first 24 hours.   Press button if you feel a symptom. You will hear a small click.  Record Date, Time and Symptom in the Patient Log Book.   When you are ready to remove patch, follow instructions on last 2 pages of Patient Log Book.  Stick patch monitor onto last page of Patient Log Book.   Place Patient Log Book in [place] box.  Use locking tab on box and tape box closed securely.  The [employer] and [employer] has [employer] on it.  Please place in mailbox as soon as possible.  Your physician should have your test results approximately 7 days after the monitor has been mailed back to [facility].   Call [place] at [phone] if you have questions regarding your ZIO XT patch monitor.  Call them immediately if you see an orange light blinking on your monitor.   If your monitor falls off in less than 4 days contact our Monitor department at [phone].  If your  monitor becomes loose or falls off after 4 days call [employer] at [phone] for suggestions on securing your monitor.      Signed, Buford Dresser, MD PhD 10/26/2019    [place]

## 2019-10-26 NOTE — Progress Notes (Signed)
Patient ID: Shannon Woodard, female   DOB: 17-Nov-1965, 54 y.o.   MRN: [id number] Patient enrolled for 7 day ZIO XT long term holter monitor to be shipped to her home.

## 2019-10-26 NOTE — Patient Instructions (Signed)
Medication Instructions:  Your Physician recommend you continue on your current medication as directed.    *If you need a refill on your cardiac medications before your next appointment, please call your pharmacy*   Lab Work: None  Testing/Procedures: Calcium Score [address]  [place] has recommended that you wear an 7  DAY ZIO-PATCH monitor. The Zio patch cardiac monitor continuously records heart rhythm data for up to 14 days, this is for patients being evaluated for multiple types heart rhythms. For the first 24 hours post application, please avoid getting the Zio monitor wet in the shower or by excessive sweating during exercise. After that, feel free to carry on with regular activities. Keep soaps and lotions away from the ZIO XT Patch.   Someone from our office will call to have monitor mailed       Follow-Up: At [facility], you and your health needs are our priority.  As part of our continuing mission to provide you with exceptional heart care, we have created designated Provider Care Teams.  These Care Teams include your primary Cardiologist (physician) and Advanced Practice Providers (APPs -  Physician Assistants and Nurse Practitioners) who all work together to provide you with the care you need, when you need it.  We recommend signing up for the patient portal called "MyChart".  Sign up information is provided on this After Visit Summary.  MyChart is used to connect with patients for Virtual Visits (Telemedicine).  Patients are able to view lab/test results, encounter notes, upcoming appointments, etc.  Non-urgent messages can be sent to your provider as well.   To learn more about what you can do with MyChart, go to [email or web address].    Your next appointment:   2 year(s)  The format for your next appointment:   In Person  Provider:   Buford Dresser, MD  [place] Instructions   Your physician has requested  you wear your ZIO patch monitor___7____days.   This is a single patch monitor.  [employer] supplies one patch monitor per enrollment.  Additional stickers are not available.   Please do not apply patch if you will be having a Nuclear Stress Test, Echocardiogram, Cardiac CT, MRI, or Chest Xray during the time frame you would be wearing the monitor. The patch cannot be worn during these tests.  You cannot remove and re-apply the ZIO XT patch monitor.   Your ZIO patch monitor will be sent USPS Priority mail from [facility] directly to your home address. The monitor may also be mailed to a PO BOX if home delivery is not available.   It may take 3-5 days to receive your monitor after you have been enrolled.   Once you have received you monitor, please review enclosed instructions.  Your monitor has already been registered assigning a specific monitor serial # to you.   Applying the monitor   Shave hair from upper left chest.   Hold abrader disc by orange tab.  Rub abrader in 40 strokes over left upper chest as indicated in your monitor instructions.   Clean area with 4 enclosed alcohol pads .  Use all pads to assure are is cleaned thoroughly.  Let dry.   Apply patch as indicated in monitor instructions.  Patch will be place under collarbone on left side of chest with arrow pointing upward.   Rub patch adhesive wings for 2 minutes.Remove white label marked "1".  Remove white label marked "2".  Rub  patch adhesive wings for 2 additional minutes.   While looking in a mirror, press and release button in center of patch.  A small green light will flash 3-4 times .  This will be your only indicator the monitor has been turned on.     Do not shower for the first 24 hours.  You may shower after the first 24 hours.   Press button if you feel a symptom. You will hear a small click.  Record Date, Time and Symptom in the Patient Log Book.   When you are ready to remove patch, follow instructions on  last 2 pages of Patient Log Book.  Stick patch monitor onto last page of Patient Log Book.   Place Patient Log Book in [place] box.  Use locking tab on box and tape box closed securely.  The [employer] has [employer] on it.  Please place in mailbox as soon as possible.  Your physician should have your test results approximately 7 days after the monitor has been mailed back to [facility].   Call [place] at [phone] if you have questions regarding your ZIO XT patch monitor.  Call them immediately if you see an orange light blinking on your monitor.   If your monitor falls off in less than 4 days contact our Monitor department at [phone].  If your monitor becomes loose or falls off after 4 days call [employer] at [phone] for suggestions on securing your monitor.

## 2019-10-30 ENCOUNTER — Ambulatory Visit (INDEPENDENT_AMBULATORY_CARE_PROVIDER_SITE_OTHER): Payer: 59

## 2019-10-30 DIAGNOSIS — R002 Palpitations: Secondary | ICD-10-CM

## 2019-12-13 ENCOUNTER — Other Ambulatory Visit: Payer: 59

## 2019-12-14 ENCOUNTER — Encounter: Payer: Self-pay | Admitting: Cardiology

## 2020-02-13 IMAGING — MR MR HEAD WO/W CM
12 series · 48 of 48 positions shown · IV contrast (multihance)
Comparison: None.

CLINICAL DATA: Acute facial pain

EXAM:
MRI HEAD WITHOUT AND WITH CONTRAST
TECHNIQUE: Multiplanar, multiecho pulse sequences of the brain and surrounding
structures were obtained without and with intravenous contrast.
CONTRAST:  10mL MULTIHANCE GADOBENATE DIMEGLUMINE 529 MG/ML IV SOLN

[Series 5: T1 · sagittal · 4.0mm · 0.75mm/px · 1 of 28 slices shown (1 of 3)]
[im 1/28]
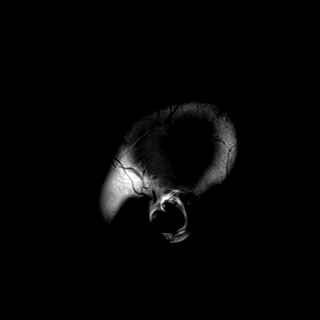

[Series 6: DWI · axial · 3.0mm · 1.44mm/px · z∈[-66,+79]mm · 5 of 90 slices shown (1 of 4)]
[im 1/90]
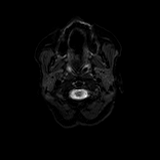
[im 23/90]
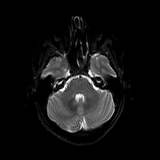
[im 45/90]
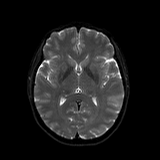
[im 67/90]
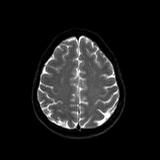
[im 90/90]
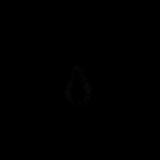

[Series 7: DWI · axial · 3.0mm · 1.44mm/px · z∈[-66,+79]mm · 2 of 41 slices shown (2 of 4)]
[im 1/41]
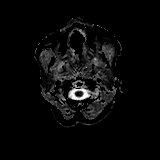
[im 41/41]
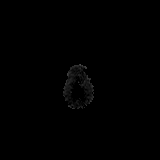

[Series 8: DWI · coronal · 5.0mm · 1.44mm/px · 4 of 60 slices shown (3 of 4)]
[im 1/60]
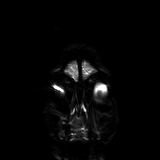
[im 20/60]
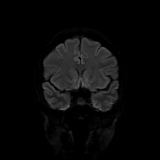
[im 40/60]
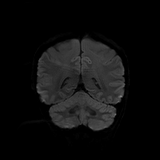
[im 60/60]
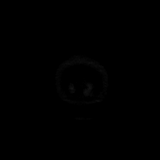

[Series 9: DWI · coronal · 5.0mm · 1.44mm/px · 2 of 30 slices shown (4 of 4)]
[im 1/30]
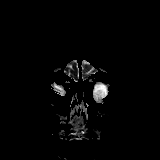
[im 30/30]
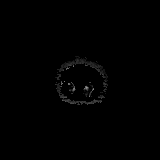

[Series 10: T2 · axial · 4.0mm · 0.36mm/px · z∈[-69,+81]mm · 2 of 30 slices shown]
[im 1/30]
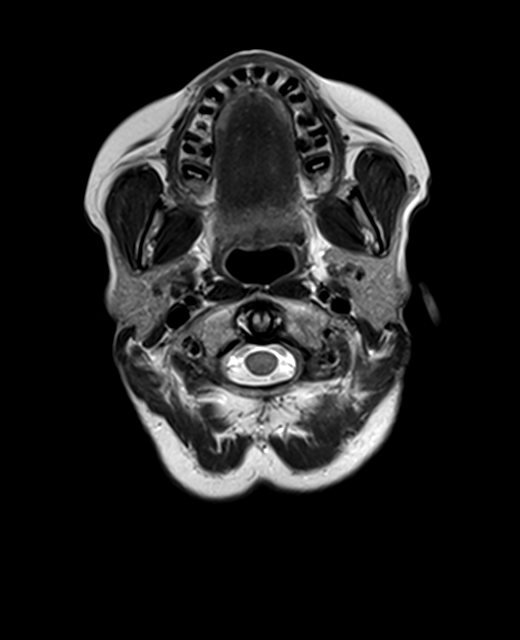
[im 30/30]
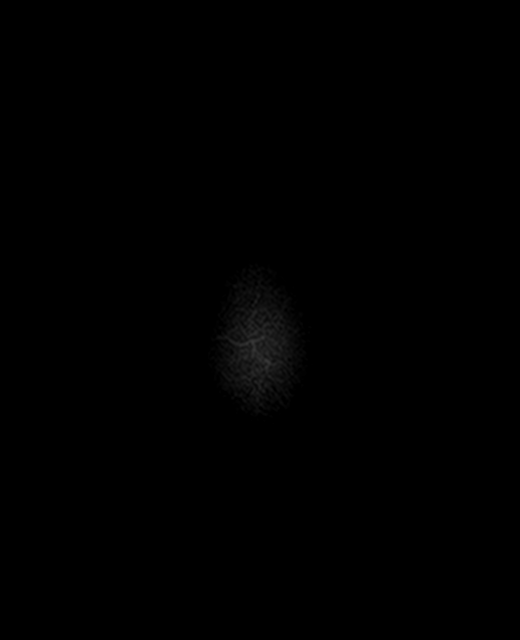

[Series 11: FLAIR · axial · 3.0mm · 0.72mm/px · z∈[-70,+79]mm · 2 of 26 slices shown]
[im 1/26]
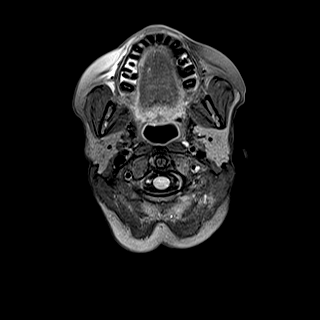
[im 26/26]
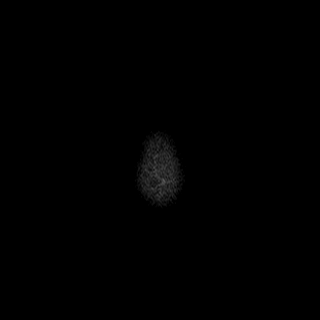

[Series 13: swi_images · axial · 1.5mm · 0.90mm/px · z∈[-64,+78]mm · 6 of 96 slices shown]
[im 1/96]
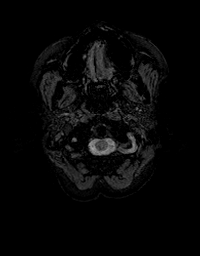
[im 20/96]
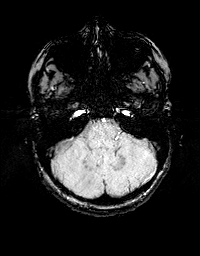
[im 39/96]
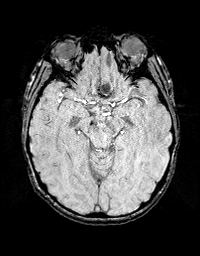
[im 58/96]
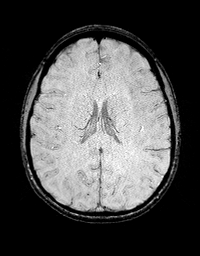
[im 77/96]
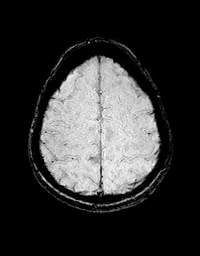
[im 96/96]
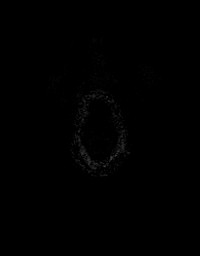

[Series 14: T1 · axial · 1.0mm · 0.94mm/px · z∈[-83,+71]mm · 10 of 157 slices shown (2 of 3)]
[im 1/157]
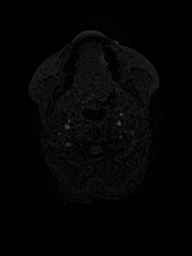
[im 18/157]
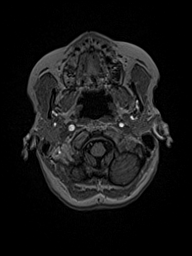
[im 35/157]
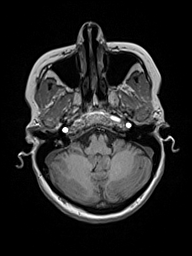
[im 53/157]
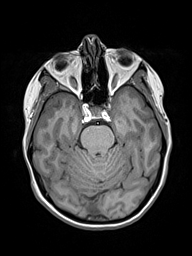
[im 70/157]
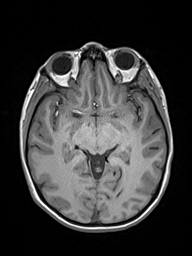
[im 87/157]
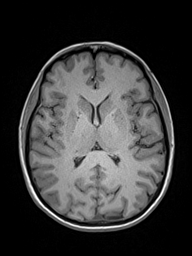
[im 105/157]
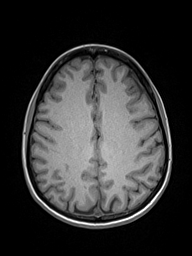
[im 122/157]
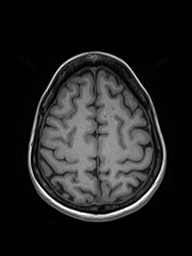
[im 139/157]
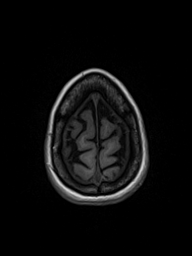
[im 157/157]
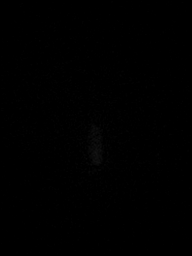

[Series 15: T2 post-contrast · coronal · 4.0mm · 0.36mm/px · 2 of 32 slices shown]
[im 1/32]
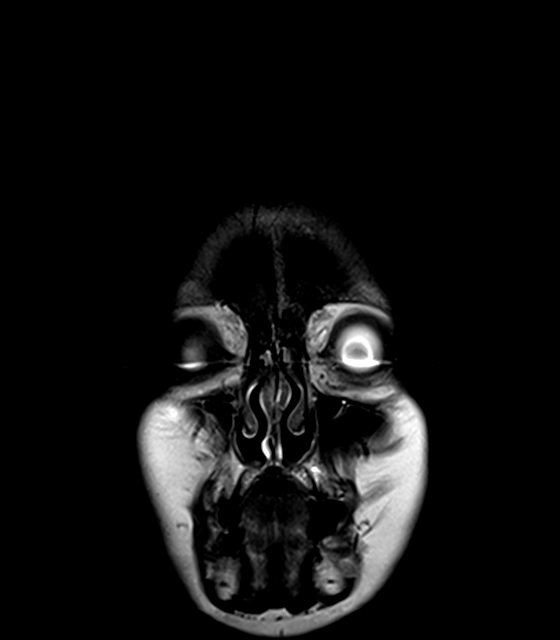
[im 32/32]
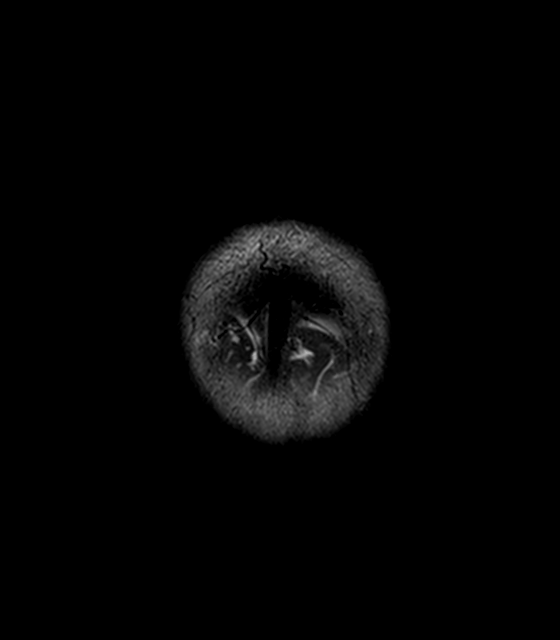

[Series 16: T1 · axial · 1.0mm · 0.94mm/px · z∈[-83,+72]mm · 10 of 158 slices shown (3 of 3)]
[im 1/158]
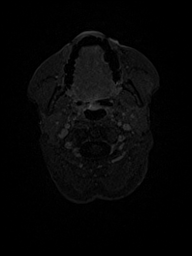
[im 18/158]
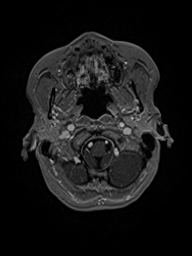
[im 35/158]
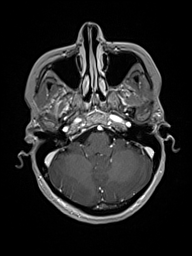
[im 53/158]
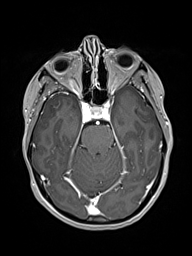
[im 70/158]
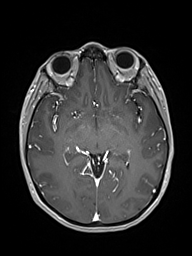
[im 88/158]
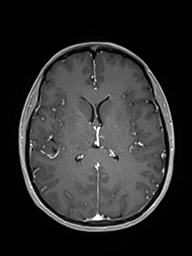
[im 105/158]
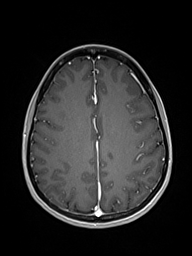
[im 123/158]
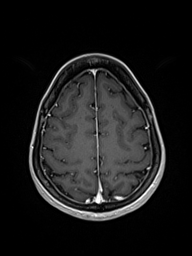
[im 140/158]
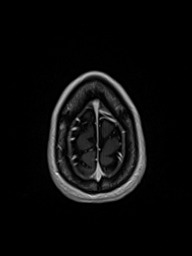
[im 158/158]
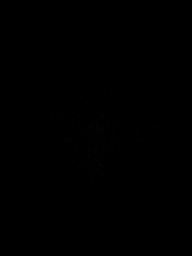

[Series 17: T1 post-contrast · coronal · 4.0mm · 0.72mm/px · 2 of 32 slices shown]
[im 1/32]
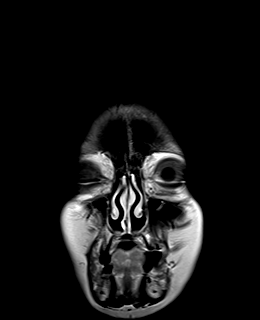
[im 32/32]
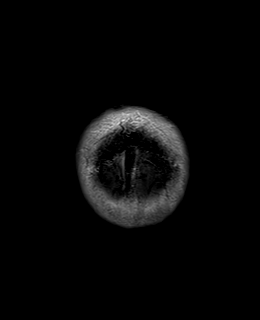

[48 of 48 positions shown; findings below may reference images not displayed]

FINDINGS: Brain: Ventricle size and cerebral volume normal. Negative for acute
infarct, hemorrhage, mass. Small subcortical hyperintensity right
frontal white matter. Small hyperintensity left posterior temporal
white matter. No periventricular white matter lesions are seen that
are highly suggestive of multiple sclerosis. Normal brainstem. Small
hyperintensity left lateral cerebellum may represent chronic
infarction.

2 mm enhancing lesion in the right pons with anterior draining vein.

Vascular: Normal arterial flow voids.

Skull and upper cervical spine: Negative

Sinuses/Orbits: Mucosal edema paranasal sinuses.  Normal orbit

Other: None
IMPRESSION: 2 mm enhancing lesion right pons with anterior draining vein.
Possible small vascular malformation such as capillary
telangiectasia.

Mild chronic ischemic change.

## 2020-07-08 ENCOUNTER — Other Ambulatory Visit: Payer: Self-pay

## 2020-07-08 DIAGNOSIS — I129 Hypertensive chronic kidney disease with stage 1 through stage 4 chronic kidney disease, or unspecified chronic kidney disease: Secondary | ICD-10-CM | POA: Insufficient documentation

## 2020-07-08 DIAGNOSIS — N200 Calculus of kidney: Secondary | ICD-10-CM | POA: Diagnosis not present

## 2020-07-08 DIAGNOSIS — R109 Unspecified abdominal pain: Secondary | ICD-10-CM | POA: Diagnosis present

## 2020-07-08 DIAGNOSIS — N189 Chronic kidney disease, unspecified: Secondary | ICD-10-CM | POA: Diagnosis not present

## 2020-07-09 ENCOUNTER — Encounter (HOSPITAL_COMMUNITY): Payer: Self-pay | Admitting: Emergency Medicine

## 2020-07-09 ENCOUNTER — Emergency Department (HOSPITAL_COMMUNITY)
Admission: EM | Admit: 2020-07-09 | Discharge: 2020-07-09 | Disposition: A | Discharge status: Home / Self Care | Payer: 59 | Attending: Emergency Medicine | Admitting: Emergency Medicine

## 2020-07-09 ENCOUNTER — Emergency Department (HOSPITAL_COMMUNITY): Payer: 59

## 2020-07-09 DIAGNOSIS — N200 Calculus of kidney: Secondary | ICD-10-CM

## 2020-07-09 LAB — COMPREHENSIVE METABOLIC PANEL
ALT: 18 U/L (ref 0–44)
AST: 24 U/L (ref 15–41)
Albumin: 4.4 g/dL (ref 3.5–5.0)
Alkaline Phosphatase: 78 U/L (ref 38–126)
Anion gap: 11 (ref 5–15)
BUN: 20 mg/dL (ref 6–20)
CO2: 20 mmol/L — ABNORMAL LOW (ref 22–32)
Calcium: 9.2 mg/dL (ref 8.9–10.3)
Chloride: 109 mmol/L (ref 98–111)
Creatinine, Ser: 1.14 mg/dL — ABNORMAL HIGH (ref 0.44–1.00)
GFR, Estimated: 58 mL/min — ABNORMAL LOW (ref >60)
Glucose, Bld: 132 mg/dL — ABNORMAL HIGH (ref 70–99)
Potassium: 3.3 mmol/L — ABNORMAL LOW (ref 3.5–5.1)
Sodium: 140 mmol/L (ref 135–145)
Total Bilirubin: 0.5 mg/dL (ref 0.3–1.2)
Total Protein: 6.6 g/dL (ref 6.5–8.1)

## 2020-07-09 LAB — CBC
HCT: 41 % (ref 36.0–46.0)
Hemoglobin: 13.8 g/dL (ref 12.0–15.0)
MCH: 33.1 pg (ref 26.0–34.0)
MCHC: 33.7 g/dL (ref 30.0–36.0)
MCV: 98.3 fL (ref 80.0–100.0)
Platelets: 166 10*3/uL (ref 150–400)
RBC: 4.17 MIL/uL (ref 3.87–5.11)
RDW: 11.9 % (ref 11.5–15.5)
WBC: 9.2 10*3/uL (ref 4.0–10.5)
nRBC: 0 % (ref 0.0–0.2)

## 2020-07-09 LAB — URINALYSIS, ROUTINE W REFLEX MICROSCOPIC
Bacteria, UA: NONE SEEN
Bilirubin Urine: NEGATIVE
Glucose, UA: NEGATIVE mg/dL
Ketones, ur: NEGATIVE mg/dL
Nitrite: NEGATIVE
Protein, ur: NEGATIVE mg/dL
RBC / HPF: >50 RBC/hpf — ABNORMAL HIGH (ref 0–5)
Specific Gravity, Urine: 1.011 (ref 1.005–1.030)
pH: 7 (ref 5.0–8.0)

## 2020-07-09 MED ORDER — ONDANSETRON 4 MG PO TBDP
4.0000 mg | ORAL_TABLET | Freq: Once | ORAL | Status: AC | PRN
  Administered 2020-07-09: 4 mg via ORAL
  Filled 2020-07-09: qty 1

## 2020-07-09 MED ORDER — HYDROMORPHONE HCL 1 MG/ML IJ SOLN
0.5000 mg | Freq: Once | INTRAMUSCULAR | Status: AC
  Administered 2020-07-09: 0.5 mg via INTRAVENOUS
  Filled 2020-07-09: qty 1

## 2020-07-09 MED ORDER — TAMSULOSIN HCL 0.4 MG PO CAPS: 0.4000 mg | ORAL_CAPSULE | Freq: Every day | ORAL | 0 refills | Status: AC

## 2020-07-09 MED ORDER — KETOROLAC TROMETHAMINE 15 MG/ML IJ SOLN
15.0000 mg | Freq: Once | INTRAMUSCULAR | Status: AC
  Administered 2020-07-09: 15 mg via INTRAVENOUS
  Filled 2020-07-09: qty 1

## 2020-07-09 MED ORDER — SODIUM CHLORIDE 0.9 % IV BOLUS
1000.0000 mL | Freq: Once | INTRAVENOUS | Status: AC
  Administered 2020-07-09: 1000 mL via INTRAVENOUS

## 2020-07-09 MED ORDER — OXYCODONE HCL 5 MG PO TABS: 5.0000 mg | ORAL_TABLET | ORAL | 0 refills | Status: AC | PRN

## 2020-07-09 NOTE — ED Provider Notes (Signed)
[facility] Emergency Department Provider Note MRN:  [id number]  Arrival date & time: 07/09/20     Chief Complaint   Nausea and Flank Pain   History of Present Illness   Shannon Woodard is a 55 y.o. year-old female with a history of CKD, rheumatoid arthritis presenting to the ED with chief complaint of flank.  Location: Left flank with radiation into the left lower Duration: 5 hours Onset: Sudden Timing: Constant Description: Sharp Severity: Severe Exacerbating/Alleviating Factors: None, unrelenting pain Associated Symptoms: Nausea, dry heaves Pertinent Negatives: Denies fever, no dysuria, no hematuria, no chest pain or shortness of breath.   Review of Systems  A complete 10 system review of systems was obtained and all systems are negative except as noted in the HPI and PMH.   Patient's Health History    Past Medical History:  Diagnosis Date  . Chronic kidney disease    hx kidney failure- resolved  . 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 Headache    migraines  . Hypertension   . Pancreatitis   . Raynauds phenomenon   . Rheumatoid arthritis ([place])   . T.T.P. syndrome    resolved    Past Surgical History:  Procedure Laterality Date  . [place]  . ROTATOR CUFF REPAIR Left 2016  . SINUS SURGERY WITH INSTATRAK  2006    Family History  Problem Relation Age of Onset  . Cancer Mother        brain  . Thyroid disease Sister   . Diabetes Brother   . Alzheimer's disease Paternal Grandmother   . Cancer Paternal Grandfather        bone cancer    Social History   Socioeconomic History  . Marital status: Divorced    Spouse name: Not on file  . Number of children: 2  . Years of education: 62  . Highest education level: Not on file  Occupational History    Comment: Corporate treasurer  Tobacco Use  . Smoking status: Never Smoker  . Smokeless tobacco: Never Used  Substance and Sexual Activity  .  Alcohol use: Yes    Comment: rarely  . Drug use: No  . Sexual activity: Yes  Other Topics Concern  . Not on file  Social History Narrative   Lives at home with sister, brother-in-law; has significant other   caffeine use- coffee, 1-2 cups daily    Social Determinants of Health   Financial Resource Strain: Not on file  Food Insecurity: Not on file  Transportation Needs: Not on file  Physical Activity: Not on file  Stress: Not on file  Social Connections: Not on file  Intimate Partner Violence: Not on file     Physical Exam   Vitals:   07/09/20 0200 07/09/20 0215  BP: (!) 97/58 109/63  Pulse: 89 85  Resp: 19 16  Temp:    SpO2: 100% 99%    CONSTITUTIONAL: Well-appearing, in moderate distress due to pain NEURO:  Alert and oriented x 3, no focal deficits EYES:  eyes equal and reactive ENT/NECK:  no LAD, no JVD CARDIO: Regular rate, well-perfused, normal S1 and S2 PULM:  CTAB no wheezing or rhonchi GI/GU:  normal bowel sounds, non-distended, non-tender MSK/SPINE:  No gross deformities, no edema SKIN:  no rash, atraumatic PSYCH:  Appropriate speech and behavior  *Additional and/or pertinent findings included in MDM below  Diagnostic and Interventional Summary    EKG Interpretation  Date/Time:  Ventricular Rate:    PR Interval:    QRS Duration:   QT Interval:    QTC Calculation:   R Axis:     Text Interpretation:        Labs Reviewed  URINALYSIS, ROUTINE W REFLEX MICROSCOPIC - Abnormal; Notable for the following components:      Result Value   APPearance CLOUDY (*)    Hgb urine dipstick MODERATE (*)    Leukocytes,Ua TRACE (*)    RBC / HPF >50 (*)    All other components within normal limits  COMPREHENSIVE METABOLIC PANEL - Abnormal; Notable for the following components:   Potassium 3.3 (*)    CO2 20 (*)    Glucose, Bld 132 (*)    Creatinine, Ser 1.14 (*)    GFR, Estimated 58 (*)    All other components within normal limits  URINE CULTURE  CBC     CT RENAL STONE STUDY  Final Result      Medications  HYDROmorphone (DILAUDID) injection 0.5 mg (has no administration in time range)  ondansetron (ZOFRAN-ODT) disintegrating tablet 4 mg (4 mg Oral Given 07/09/20 0057)  ketorolac (TORADOL) 15 MG/ML injection 15 mg (15 mg Intravenous Given 07/09/20 0057)  HYDROmorphone (DILAUDID) injection 0.5 mg (0.5 mg Intravenous Given 07/09/20 0057)  sodium chloride 0.9 % bolus 1,000 mL (0 mLs Intravenous Stopped 07/09/20 0203)     Procedures  /  Critical Care Procedures  ED Course and Medical Decision Making  I have reviewed the triage vital signs, the nursing notes, and pertinent available records from the EMR.  Listed above are laboratory and imaging tests that I personally ordered, reviewed, and interpreted and then considered in my medical decision making (see below for details).  Suspect kidney stone versus diverticulitis versus less likely ovarian pathology, obtaining CT imaging.     CT confirms kidney stone, 4 mm, some hydro-. Labs are overall reassuring, small increase in creatinine, no signs of UTI. Patient's pain is better controlled, appropriate for discharge.  Barth Kirks. Sedonia Small, MD [place] [email or web address]  Final Clinical Impressions(s) / ED Diagnoses     ICD-10-CM   1. Kidney stone  N20.0     ED Discharge Orders         Ordered    oxyCODONE (ROXICODONE) 5 MG immediate release tablet  Every 4 hours PRN        07/09/20 0433    tamsulosin (FLOMAX) 0.4 MG CAPS capsule  Daily        07/09/20 0433           Discharge Instructions Discussed with and Provided to Patient:     Discharge Instructions     You were evaluated in the Emergency Department and after careful evaluation, we did not find any emergent condition requiring admission or further testing in the hospital.  Your exam/testing today was overall reassuring. Symptoms seem to be due to a kidney stone. Please take  Tylenol 1000 mg every 4-6 hours and Motrin 600 mg every 4-6 hours. You can use the oxycodone tablets as needed for more significant pain. Use the Flomax as needed to help you pass the kidney stone. We recommend follow-up with the urologists.  Please return to the Emergency Department if you experience any worsening of your condition.  Thank you for allowing [place] to be a part of your care.        Maudie Flakes, MD 07/09/20 [phone]

## 2020-07-09 NOTE — Discharge Instructions (Addendum)
You were evaluated in the Emergency Department and after careful evaluation, we did not find any emergent condition requiring admission or further testing in the hospital.  Your exam/testing today was overall reassuring. Symptoms seem to be due to a kidney stone. Please take Tylenol 1000 mg every 4-6 hours and Motrin 600 mg every 4-6 hours. You can use the oxycodone tablets as needed for more significant pain. Use the Flomax as needed to help you pass the kidney stone. We recommend follow-up with the urologists.  Please return to the Emergency Department if you experience any worsening of your condition.  Thank you for allowing [place] to be a part of your care.

## 2020-07-09 NOTE — ED Triage Notes (Signed)
Patient presents from home with a sudden onset of left flank pain that radiated around into her pelvis that began around 1900. Pain has been constant. Patient complaining of nausea, no other symptoms. Patient noted to be dry heaving in triage.

## 2020-07-10 LAB — URINE CULTURE: Culture: NO GROWTH

## 2020-09-06 ENCOUNTER — Other Ambulatory Visit: Payer: Self-pay | Admitting: Adult Health

## 2020-09-06 DIAGNOSIS — D839 Common variable immunodeficiency, unspecified: Secondary | ICD-10-CM | POA: Insufficient documentation

## 2020-09-06 NOTE — Progress Notes (Signed)
I connected by phone with Shannon Woodard on 09/06/2020, 5:15 PM to discuss the potential use of a new treatment, tixagevimab/cilgavimab, for pre-exposure prophylaxis for prevention of coronavirus disease 2019 (COVID-19) caused by the SARS-CoV-2 virus.  This patient is a 55 y.o. female that meets the FDA criteria for Emergency Use Authorization of tixagevimab/cilgavimab for pre-exposure prophylaxis of COVID-19 disease. Pt meets following criteria:  Age >12 yr and weight > 40kg  Not currently infected with SARS-CoV-2 and has no known recent exposure to an individual infected with SARS-CoV-2 AND o Who has moderate to severe immune compromise due to a medical condition or receipt of immunosuppressive medications or treatments and may not mount an adequate immune response to COVID-19 vaccination or  o Vaccination with any available COVID-19 vaccine, according to the approved or authorized schedule, is not recommended due to a history of severe adverse reaction (e.g., severe allergic reaction) to a COVID-19 vaccine(s) and/or COVID-19 vaccine component(s).  o Patient meets the following definition of mod-severe immune compromised status: 8. Other groups not previously mentioned: 3. CVID  I have spoken and communicated the following to the patient or parent/caregiver regarding COVID monoclonal antibody treatment:  1. FDA has authorized the emergency use of tixagevimab/cilgavimab for the pre-exposure prophylaxis of COVID-19 in patients with moderate-severe immunocompromised status, who meet above EUA criteria.  2. The significant known and potential risks and benefits of COVID monoclonal antibody, and the extent to which such potential risks and benefits are unknown.  3. Information on available alternative treatments and the risks and benefits of those alternatives, including clinical trials.  4. The patient or parent/caregiver has the option to accept or refuse COVID monoclonal antibody  treatment.  After reviewing this information with the patient, agree to receive tixagevimab/cilgavimab. Left message with [name] at [facility] for an appointment date/time.    Scot Dock, NP, 09/06/2020, 5:15 PM

## 2020-09-15 ENCOUNTER — Encounter (HOSPITAL_COMMUNITY): Payer: 59

## 2020-09-21 ENCOUNTER — Ambulatory Visit (HOSPITAL_COMMUNITY)
Admission: RE | Admit: 2020-09-21 | Discharge: 2020-09-21 | Disposition: A | Discharge status: Home / Self Care | Payer: 59 | Source: Ambulatory Visit | Attending: Pulmonary Disease | Admitting: Pulmonary Disease

## 2020-09-21 ENCOUNTER — Other Ambulatory Visit: Payer: Self-pay

## 2020-09-21 DIAGNOSIS — D839 Common variable immunodeficiency, unspecified: Secondary | ICD-10-CM | POA: Insufficient documentation

## 2020-09-21 MED ORDER — DIPHENHYDRAMINE HCL 50 MG/ML IJ SOLN
50.0000 mg | Freq: Once | INTRAMUSCULAR | Status: DC | PRN
Start: 2020-09-21 — End: 2020-09-22

## 2020-09-21 MED ORDER — METHYLPREDNISOLONE SODIUM SUCC 125 MG IJ SOLR: 125.0000 mg | Freq: Once | INTRAMUSCULAR | Status: DC | PRN

## 2020-09-21 MED ORDER — EPINEPHRINE 0.3 MG/0.3ML IJ SOAJ: 0.3000 mg | Freq: Once | INTRAMUSCULAR | Status: DC | PRN

## 2020-09-21 MED ORDER — FAMOTIDINE IN NACL 20-0.9 MG/50ML-% IV SOLN: 20.0000 mg | Freq: Once | INTRAVENOUS | Status: DC | PRN

## 2020-09-21 MED ORDER — CILGAVIMAB (PART OF EVUSHELD) INJECTION
300.0000 mg | Freq: Once | INTRAMUSCULAR | Status: AC
  Administered 2020-09-21: 300 mg via INTRAMUSCULAR
  Filled 2020-09-21: qty 3

## 2020-09-21 MED ORDER — TIXAGEVIMAB (PART OF EVUSHELD) INJECTION
300.0000 mg | Freq: Once | INTRAMUSCULAR | Status: AC
  Administered 2020-09-21: 300 mg via INTRAMUSCULAR
  Filled 2020-09-21: qty 3

## 2020-09-21 MED ORDER — ALBUTEROL SULFATE HFA 108 (90 BASE) MCG/ACT IN AERS
2.0000 | INHALATION_SPRAY | Freq: Once | RESPIRATORY_TRACT | Status: DC | PRN
Start: 2020-09-21 — End: 2020-09-22
  Filled 2020-09-21: qty 6.7

## 2021-11-26 ENCOUNTER — Encounter: Payer: Self-pay | Admitting: Cardiology
